# Patient Record
Sex: Male | Born: 1990 | Race: Black or African American | Hispanic: No | Marital: Single | Smoking: Current some day smoker
Health system: Southern US, Community
[De-identification: ages and names within clinical notes are randomized; demographics above are authoritative.]

## PROBLEM LIST (undated history)

## (undated) ENCOUNTER — Emergency Department (HOSPITAL_COMMUNITY): Admission: EM | Payer: Self-pay | Source: Home / Self Care

## (undated) HISTORY — PX: DENTAL SURGERY: SHX609

---
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
## 2017-03-13 ENCOUNTER — Other Ambulatory Visit: Payer: Self-pay

## 2017-03-13 ENCOUNTER — Encounter (HOSPITAL_COMMUNITY): Payer: Self-pay | Admitting: Emergency Medicine

## 2017-03-13 ENCOUNTER — Ambulatory Visit (HOSPITAL_COMMUNITY)
Admission: EM | Admit: 2017-03-13 | Discharge: 2017-03-13 | Disposition: A | Discharge status: Home / Self Care | Payer: Self-pay | Attending: Family Medicine | Admitting: Family Medicine

## 2017-03-13 DIAGNOSIS — Z711 Person with feared health complaint in whom no diagnosis is made: Secondary | ICD-10-CM

## 2017-03-13 DIAGNOSIS — Z202 Contact with and (suspected) exposure to infections with a predominantly sexual mode of transmission: Secondary | ICD-10-CM | POA: Insufficient documentation

## 2017-03-13 DIAGNOSIS — F17200 Nicotine dependence, unspecified, uncomplicated: Secondary | ICD-10-CM | POA: Insufficient documentation

## 2017-03-13 DIAGNOSIS — Z113 Encounter for screening for infections with a predominantly sexual mode of transmission: Secondary | ICD-10-CM

## 2017-03-13 MED ORDER — AZITHROMYCIN 250 MG PO TABS
ORAL_TABLET | ORAL | Status: AC
  Filled 2017-03-13: qty 4

## 2017-03-13 MED ORDER — METRONIDAZOLE 500 MG PO TABS
ORAL_TABLET | ORAL | Status: AC
  Filled 2017-03-13: qty 4

## 2017-03-13 MED ORDER — METRONIDAZOLE 500 MG PO TABS
2000.0000 mg | ORAL_TABLET | Freq: Once | ORAL | Status: AC
  Administered 2017-03-13: 2000 mg via ORAL

## 2017-03-13 MED ORDER — AZITHROMYCIN 250 MG PO TABS
1000.0000 mg | ORAL_TABLET | Freq: Once | ORAL | Status: AC
  Administered 2017-03-13: 1000 mg via ORAL

## 2017-03-13 MED ORDER — STERILE WATER FOR INJECTION IJ SOLN
INTRAMUSCULAR | Status: AC
  Filled 2017-03-13: qty 10

## 2017-03-13 MED ORDER — CEFTRIAXONE SODIUM 250 MG IJ SOLR
INTRAMUSCULAR | Status: AC
Start: 2017-03-13 — End: ?
  Filled 2017-03-13: qty 250

## 2017-03-13 MED ORDER — CEFTRIAXONE SODIUM 250 MG IJ SOLR
250.0000 mg | Freq: Once | INTRAMUSCULAR | Status: AC
  Administered 2017-03-13: 250 mg via INTRAMUSCULAR

## 2017-03-13 NOTE — Discharge Instructions (Signed)
You have been treated today for gonorrhea, chlamydia and trichomonas. Please withhold from intercourse for the next week. Use of condoms to prevent std's in the future. Will call you with any positive findings.

## 2017-03-13 NOTE — ED Provider Notes (Signed)
[facility]    CSN: [id number] Arrival date & time: 03/13/17  1103     History   Chief Complaint Chief Complaint  Patient presents with  . Exposure to STD    HPI Kyle Farmer is a 27 y.o. male.   Shreyan presents with friend with concerns of exposure to trichomonas. States he was called today and was told of possible exposure. States today he might feel a slight tingling to the tip of his penis. Otherwise without dysuria, discharge, or lesions. Denies back pain, abdominal pain, fevers. Has three partners, does not regularly use condoms. Otherwise without medical history. No known previous std. Requests full treatment for concerns for std's.    ROS per HPI.       History reviewed. No pertinent past medical history.  There are no active problems to display for this patient.   History reviewed. No pertinent surgical history.     Home Medications    Prior to Admission medications   Not on File    Family History No family history on file.  Social History Social History   Tobacco Use  . Smoking status: Current Some Day Smoker  Substance Use Topics  . Alcohol use: Yes  . Drug use: Not on file     Allergies   Patient has no known allergies.   Review of Systems Review of Systems   Physical Exam Triage Vital Signs ED Triage Vitals [03/13/17 1156]  Enc Vitals Group     BP 140/85     Pulse Rate 67     Resp 14     Temp 98.2 F (36.8 C)     Temp src      SpO2 100 %     Weight      Height      Head Circumference      Peak Flow      Pain Score      Pain Loc      Pain Edu?      Excl. in GC?    No data found.  Updated Vital Signs BP 140/85 (BP Location: Right Arm)   Pulse 67   Temp 98.2 F (36.8 C)   Resp 14   SpO2 100%   Visual Acuity Right Eye Distance:   Left Eye Distance:   Bilateral Distance:    Right Eye Near:   Left Eye Near:    Bilateral Near:     Physical Exam  Constitutional: He is oriented to person, place,  and time. He appears well-developed and well-nourished.  Cardiovascular: Normal rate and regular rhythm.  Pulmonary/Chest: Effort normal and breath sounds normal.  Abdominal: There is no tenderness.  Genitourinary:  Genitourinary Comments: Denies sores, lesions or discharge, exam deferred.   Neurological: He is alert and oriented to person, place, and time.  Skin: Skin is warm and dry.     UC Treatments / Results  Labs (all labs ordered are listed, but only abnormal results are displayed) Labs Reviewed  URINE CYTOLOGY ANCILLARY ONLY    EKG  EKG Interpretation None       Radiology No results found.  Procedures Procedures (including critical care time)  Medications Ordered in UC Medications  metroNIDAZOLE (FLAGYL) tablet 2,000 mg (not administered)  azithromycin (ZITHROMAX) tablet 1,000 mg (not administered)  cefTRIAXone (ROCEPHIN) injection 250 mg (not administered)     Initial Impression / Assessment and Plan / UC Course  I have reviewed the triage vital signs and the nursing notes.  Pertinent labs & imaging results that were available during my care of the patient were reviewed by me and considered in my medical decision making (see chart for details).     Flagyl, zithromax and rocephin provided today per patient request. Cytology pending. Withhold from intercourse for the next week. Condoms to prevent std's in the future. Will call with any positive findings. Patient verbalized understanding and agreeable to plan.    Final Clinical Impressions(s) / UC Diagnoses   Final diagnoses:  Concern about STD in male without diagnosis  Possible exposure to STD    ED Discharge Orders    None       Controlled Substance Prescriptions [place] Controlled Substance Registry consulted? Not Applicable   Georgetta HaberBurky, Cydnie Deason B, NP 03/13/17 1243

## 2017-03-13 NOTE — ED Triage Notes (Signed)
Pt states he got a phone call saying he needed to get tested, they were told they had trich. Pt denies symptoms.

## 2017-03-16 LAB — URINE CYTOLOGY ANCILLARY ONLY
CHLAMYDIA, DNA PROBE: NEGATIVE
NEISSERIA GONORRHEA: NEGATIVE
Trichomonas: POSITIVE — AB

## 2017-06-25 ENCOUNTER — Emergency Department (HOSPITAL_COMMUNITY)
Admission: EM | Admit: 2017-06-25 | Discharge: 2017-06-25 | Payer: Self-pay | Attending: Emergency Medicine | Admitting: Emergency Medicine

## 2017-06-25 ENCOUNTER — Encounter (HOSPITAL_COMMUNITY): Payer: Self-pay | Admitting: *Deleted

## 2017-06-25 ENCOUNTER — Other Ambulatory Visit: Payer: Self-pay

## 2017-06-25 DIAGNOSIS — Z87891 Personal history of nicotine dependence: Secondary | ICD-10-CM | POA: Insufficient documentation

## 2017-06-25 DIAGNOSIS — T5492XA Toxic effect of unspecified corrosive substance, intentional self-harm, initial encounter: Secondary | ICD-10-CM | POA: Insufficient documentation

## 2017-06-25 LAB — COMPREHENSIVE METABOLIC PANEL
ALK PHOS: 70 U/L (ref 38–126)
ALT: 16 U/L — ABNORMAL LOW (ref 17–63)
AST: 20 U/L (ref 15–41)
Albumin: 3.9 g/dL (ref 3.5–5.0)
Anion gap: 9 (ref 5–15)
BILIRUBIN TOTAL: 0.6 mg/dL (ref 0.3–1.2)
BUN: 13 mg/dL (ref 6–20)
CALCIUM: 9.5 mg/dL (ref 8.9–10.3)
CO2: 31 mmol/L (ref 22–32)
CREATININE: 0.84 mg/dL (ref 0.61–1.24)
Chloride: 99 mmol/L — ABNORMAL LOW (ref 101–111)
GFR calc Af Amer: >60 mL/min (ref >60)
GLUCOSE: 104 mg/dL — AB (ref 65–99)
POTASSIUM: 3.5 mmol/L (ref 3.5–5.1)
Sodium: 139 mmol/L (ref 135–145)
TOTAL PROTEIN: 7.4 g/dL (ref 6.5–8.1)

## 2017-06-25 LAB — URINALYSIS, ROUTINE W REFLEX MICROSCOPIC
BILIRUBIN URINE: NEGATIVE
GLUCOSE, UA: NEGATIVE mg/dL
HGB URINE DIPSTICK: NEGATIVE
KETONES UR: NEGATIVE mg/dL
Leukocytes, UA: NEGATIVE
Nitrite: NEGATIVE
PROTEIN: NEGATIVE mg/dL
Specific Gravity, Urine: 1.009 (ref 1.005–1.030)
pH: 6 (ref 5.0–8.0)

## 2017-06-25 LAB — CBC WITH DIFFERENTIAL/PLATELET
BASOS ABS: 0 10*3/uL (ref 0.0–0.1)
Basophils Relative: 0 %
Eosinophils Absolute: 0.1 10*3/uL (ref 0.0–0.7)
Eosinophils Relative: 2 %
HEMATOCRIT: 44.5 % (ref 39.0–52.0)
HEMOGLOBIN: 14.7 g/dL (ref 13.0–17.0)
Lymphocytes Relative: 16 %
Lymphs Abs: 1.2 10*3/uL (ref 0.7–4.0)
MCH: 26.9 pg (ref 26.0–34.0)
MCHC: 33 g/dL (ref 30.0–36.0)
MCV: 81.4 fL (ref 78.0–100.0)
MONO ABS: 1.1 10*3/uL — AB (ref 0.1–1.0)
Monocytes Relative: 15 %
NEUTROS ABS: 5.1 10*3/uL (ref 1.7–7.7)
NEUTROS PCT: 67 %
Platelets: 255 10*3/uL (ref 150–400)
RBC: 5.47 MIL/uL (ref 4.22–5.81)
RDW: 13.4 % (ref 11.5–15.5)
WBC: 7.5 10*3/uL (ref 4.0–10.5)

## 2017-06-25 MED ORDER — SODIUM CHLORIDE 0.9 % IV BOLUS
1000.0000 mL | Freq: Once | INTRAVENOUS | Status: AC
  Administered 2017-06-25: 1000 mL via INTRAVENOUS

## 2017-06-25 MED ORDER — ONDANSETRON HCL 4 MG/2ML IJ SOLN
4.0000 mg | Freq: Once | INTRAMUSCULAR | Status: AC
  Administered 2017-06-25: 4 mg via INTRAVENOUS
  Filled 2017-06-25: qty 2
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
## 2017-06-25 NOTE — Discharge Instructions (Addendum)
Follow-up if any problems drink plenty of fluids
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
## 2017-06-25 NOTE — ED Notes (Signed)
Pt resting with eyes closed, appears to be in no distress. Respirations are even and unlabored. Officer at bedside.

## 2017-09-04 ENCOUNTER — Ambulatory Visit (HOSPITAL_COMMUNITY)
Admission: EM | Admit: 2017-09-04 | Discharge: 2017-09-04 | Disposition: A | Discharge status: Home / Self Care | Payer: Self-pay | Attending: Internal Medicine | Admitting: Internal Medicine

## 2017-09-04 ENCOUNTER — Encounter (HOSPITAL_COMMUNITY): Payer: Self-pay | Admitting: Emergency Medicine

## 2017-09-04 DIAGNOSIS — Z87891 Personal history of nicotine dependence: Secondary | ICD-10-CM | POA: Insufficient documentation

## 2017-09-04 DIAGNOSIS — Z113 Encounter for screening for infections with a predominantly sexual mode of transmission: Secondary | ICD-10-CM

## 2017-09-04 DIAGNOSIS — Z711 Person with feared health complaint in whom no diagnosis is made: Secondary | ICD-10-CM

## 2017-09-04 DIAGNOSIS — L739 Follicular disorder, unspecified: Secondary | ICD-10-CM | POA: Insufficient documentation

## 2017-09-04 MED ORDER — MUPIROCIN 2 % EX OINT
1.0000 | TOPICAL_OINTMENT | Freq: Two times a day (BID) | CUTANEOUS | 0 refills | Status: DC
Start: 2017-09-04 — End: 2018-07-20

## 2017-09-04 NOTE — Discharge Instructions (Signed)
As discussed, rash to the groin area most consistent with folliculitis. Apply bactroban ointment to the area. Warm compres. Testing sent, you will be contacted with any positive results that requires further treatment. Refrain from sexual activity and alcohol use for the next 7 days. Monitor for any worsening of symptoms, fever, abdominal pain, nausea, vomiting, testicular swelling/pain, penile lesion/ulcer/sore to follow up for reevaluation.

## 2017-09-04 NOTE — ED Triage Notes (Signed)
Pt requesting STD screening and c/o rash in genital area

## 2017-09-04 NOTE — ED Provider Notes (Signed)
[facility]    CSN: [id number] Arrival date & time: 09/04/17  1601     History   Chief Complaint Chief Complaint  Patient presents with  . Exposure to STD    HPI Kyle Farmer is a 27 y.o. male.   27 year old male comes in for STD testing and rash to the groin area.  States rash started after shaving, with whiteheads, itching, irritation.  Denies spreading erythema, increased warmth, fever.  Denies penile discharge, penile ulcer, testicular swelling, testicular pain.  Denies abdominal pain, nausea, vomiting.  Denies fever, chills, night sweats.  Denies urinary symptoms such as frequency, dysuria, hematuria.  He is sexually active with one male partner, no condom use.  Says he would like to have STD check, including HIV and syphilis.     History reviewed. No pertinent past medical history.  There are no active problems to display for this patient.   History reviewed. No pertinent surgical history.     Home Medications    Prior to Admission medications   Medication Sig Start Date End Date Taking? Authorizing Provider  mupirocin ointment (BACTROBAN) 2 % Apply 1 application topically 2 (two) times daily. 09/04/17   Belinda Fisher, PA-C    Family History History reviewed. No pertinent family history.  Social History Social History   Tobacco Use  . Smoking status: Former Games developer  . Smokeless tobacco: Never Used  Substance Use Topics  . Alcohol use: Not Currently  . Drug use: Yes    Types: Marijuana     Allergies   Patient has no known allergies.   Review of Systems Review of Systems  Reason unable to perform ROS: See HPI as above.     Physical Exam Triage Vital Signs ED Triage Vitals [09/04/17 1629]  Enc Vitals Group     BP 121/69     Pulse Rate 68     Resp 18     Temp 98.2 F (36.8 C)     Temp Source Oral     SpO2 100 %     Weight      Height      Head Circumference      Peak Flow      Pain Score 0     Pain Loc      Pain Edu?    Excl. in GC?    No data found.  Updated Vital Signs BP 121/69 (BP Location: Left Arm)   Pulse 68   Temp 98.2 F (36.8 C) (Oral)   Resp 18   SpO2 100%   Physical Exam  Constitutional: He is oriented to person, place, and time. He appears well-developed and well-nourished. No distress.  HENT:  Head: Normocephalic and atraumatic.  Eyes: Pupils are equal, round, and reactive to light. Conjunctivae are normal.  Genitourinary:  Genitourinary Comments: No ulceration to the penis.  Folliculitis to the groin area.  Mild tenderness to palpation.  No surrounding erythema, increased warmth.  No fluctuance felt.  Neurological: He is alert and oriented to person, place, and time.  Skin: He is not diaphoretic.     UC Treatments / Results  Labs (all labs ordered are listed, but only abnormal results are displayed) Labs Reviewed  HIV ANTIBODY (ROUTINE TESTING)  RPR  URINE CYTOLOGY ANCILLARY ONLY    EKG None  Radiology No results found.  Procedures Procedures (including critical care time)  Medications Ordered in UC Medications - No data to display  Initial Impression / Assessment and Plan /  UC Course  I have reviewed the triage vital signs and the nursing notes.  Pertinent labs & imaging results that were available during my care of the patient were reviewed by me and considered in my medical decision making (see chart for details).    Bactroban for folliculitis.  Warm compress.  Blood work and cytology sent, patient will be contacted with any positive results that require additional treatment. Patient to refrain from sexual activity for the next 7 days. Return precautions given.   Final Clinical Impressions(s) / UC Diagnoses   Final diagnoses:  Folliculitis  Concern about STD in male without diagnosis    ED Prescriptions    Medication Sig Dispense Auth. Provider   mupirocin ointment (BACTROBAN) 2 % Apply 1 application topically 2 (two) times daily. 22 g Threasa AlphaYu, Ryelynn Guedea V, PA-C          Ixel Boehning V, [place]PA-C 09/04/17 [phone]

## 2017-09-05 LAB — RPR: RPR: NONREACTIVE

## 2017-09-05 LAB — HIV ANTIBODY (ROUTINE TESTING W REFLEX): HIV Screen 4th Generation wRfx: NONREACTIVE

## 2017-09-07 LAB — URINE CYTOLOGY ANCILLARY ONLY
CHLAMYDIA, DNA PROBE: NEGATIVE
Neisseria Gonorrhea: NEGATIVE
TRICH (WINDOWPATH): POSITIVE — AB

## 2017-09-08 ENCOUNTER — Telehealth (HOSPITAL_COMMUNITY): Payer: Self-pay

## 2017-09-08 MED ORDER — METRONIDAZOLE 500 MG PO TABS: 500.0000 mg | ORAL_TABLET | Freq: Two times a day (BID) | ORAL | 0 refills | Status: DC

## 2017-09-08 NOTE — Telephone Encounter (Signed)
Trichomonas is positive. Rx metronidazole 500mg  bid x 7d #14 no refills was sent to the pharmacy of record. Need to educate patient to refrain from sexual intercourse for 7 days to give the medicine time to work. Sexual partners need to be notified and tested/treated. Condoms may reduce risk of reinfection. Number provided is not correct number. Will send patient letter.

## 2017-09-09 LAB — URINE CYTOLOGY ANCILLARY ONLY
Bacterial vaginitis: NEGATIVE
CANDIDA VAGINITIS: NEGATIVE

## 2017-09-26 ENCOUNTER — Ambulatory Visit (HOSPITAL_COMMUNITY)
Admission: EM | Admit: 2017-09-26 | Discharge: 2017-09-26 | Disposition: A | Discharge status: Home / Self Care | Payer: Self-pay | Attending: Internal Medicine | Admitting: Internal Medicine

## 2017-09-26 ENCOUNTER — Other Ambulatory Visit: Payer: Self-pay

## 2017-09-26 ENCOUNTER — Encounter (HOSPITAL_COMMUNITY): Payer: Self-pay | Admitting: Emergency Medicine

## 2017-09-26 DIAGNOSIS — L509 Urticaria, unspecified: Secondary | ICD-10-CM

## 2017-09-26 MED ORDER — CEPHALEXIN 500 MG PO CAPS: 500.0000 mg | ORAL_CAPSULE | Freq: Four times a day (QID) | ORAL | 0 refills | Status: DC

## 2017-09-26 MED ORDER — PERMETHRIN 5 % EX CREA: TOPICAL_CREAM | CUTANEOUS | 0 refills | Status: DC

## 2017-09-26 MED ORDER — DEXAMETHASONE SODIUM PHOSPHATE 10 MG/ML IJ SOLN
10.0000 mg | Freq: Once | INTRAMUSCULAR | Status: AC
  Administered 2017-09-26: 10 mg via INTRAMUSCULAR

## 2017-09-26 MED ORDER — PREDNISONE 10 MG (21) PO TBPK: ORAL_TABLET | Freq: Every day | ORAL | 0 refills | Status: DC

## 2017-09-26 MED ORDER — DIPHENHYDRAMINE HCL 50 MG/ML IJ SOLN
INTRAMUSCULAR | Status: AC
  Filled 2017-09-26: qty 1

## 2017-09-26 MED ORDER — DIPHENHYDRAMINE HCL 50 MG/ML IJ SOLN
25.0000 mg | Freq: Once | INTRAMUSCULAR | Status: AC
  Administered 2017-09-26: 25 mg via INTRAMUSCULAR

## 2017-09-26 MED ORDER — DEXAMETHASONE SODIUM PHOSPHATE 10 MG/ML IJ SOLN
INTRAMUSCULAR | Status: AC
  Filled 2017-09-26: qty 1

## 2017-09-26 NOTE — ED Triage Notes (Signed)
2 days ago noticed a sting to left neck.  Then had burning and itching and neck swelling.  Then yesterday night scalp started itching.  Bumps/bites visible to right forehead and left neck.

## 2017-09-26 NOTE — ED Provider Notes (Signed)
[facility]    CSN: [id number] Arrival date & time: 09/26/17  1257     History   Chief Complaint Chief Complaint  Patient presents with  . Allergic Reaction    HPI Kyle Farmer is a 27 y.o. male.   27y.o. male presents with rash located to his neck X 2 days.  She states he was sitting at a friend's house on a couch when he thought he got bit by something.  Rashes since spread along his scalp line to his forehead.  Patient endorses pruritus. condition is acute in nature. Condition is made better by nothing. Condition is made worse by nohing. Patient denies any treatment prior to there arrival at this facility. Patient denies any fevers or friends and family with similar signs and symptoms.      History reviewed. No pertinent past medical history.  There are no active problems to display for this patient.   History reviewed. No pertinent surgical history.     Home Medications    Prior to Admission medications   Medication Sig Start Date End Date Taking? Authorizing Provider  metroNIDAZOLE (FLAGYL) 500 MG tablet Take 1 tablet (500 mg total) by mouth 2 (two) times daily. 09/08/17   Isa RankinMurray, Laura Wilson, MD  mupirocin ointment (BACTROBAN) 2 % Apply 1 application topically 2 (two) times daily. 09/04/17   Belinda FisherYu, Amy V, PA-C    Family History Family History  Problem Relation Age of Onset  . Healthy Mother     Social History Social History   Tobacco Use  . Smoking status: Current Some Day Smoker  . Smokeless tobacco: Never Used  Substance Use Topics  . Alcohol use: Not Currently  . Drug use: Not Currently    Types: Marijuana     Allergies   Patient has no known allergies.   Review of Systems Review of Systems  Constitutional: Negative for chills and fever.  HENT: Negative for ear pain and sore throat.   Eyes: Negative for pain and visual disturbance.  Respiratory: Negative for cough and shortness of breath.   Cardiovascular: Negative for chest  pain and palpitations.  Gastrointestinal: Negative for abdominal pain and vomiting.  Genitourinary: Negative for dysuria and hematuria.  Musculoskeletal: Negative for arthralgias and back pain.  Skin: Positive for rash. Negative for color change.       The back of her neck radiating around her forehead  Neurological: Negative for seizures and syncope.  All other systems reviewed and are negative.    Physical Exam Triage Vital Signs ED Triage Vitals  Enc Vitals Group     BP 09/26/17 1420 133/79     Pulse Rate 09/26/17 1420 62     Resp 09/26/17 1420 18     Temp 09/26/17 1420 97.9 F (36.6 C)     Temp Source 09/26/17 1420 Oral     SpO2 09/26/17 1420 100 %     Weight --      Height --      Head Circumference --      Peak Flow --      Pain Score 09/26/17 1417 0     Pain Loc --      Pain Edu? --      Excl. in GC? --    No data found.  Updated Vital Signs BP 133/79 (BP Location: Left Arm)   Pulse 62   Temp 97.9 F (36.6 C) (Oral)   Resp 18   SpO2 100%   Visual Acuity  Right Eye Distance:   Left Eye Distance:   Bilateral Distance:    Right Eye Near:   Left Eye Near:    Bilateral Near:     Physical Exam  Constitutional: He appears well-developed and well-nourished.  HENT:  Head: Normocephalic and atraumatic.  Eyes: Conjunctivae are normal.  Neck: Neck supple.  Cardiovascular: Normal rate and regular rhythm.  No murmur heard. Pulmonary/Chest: Effort normal and breath sounds normal. No respiratory distress.  Abdominal: Soft. There is no tenderness.  Musculoskeletal: He exhibits no edema.  Neurological: He is alert.  Skin: Skin is warm and dry. Rash noted.  Raised bumps with center blister  Psychiatric: He has a normal mood and affect.  Nursing note and vitals reviewed.    UC Treatments / Results  Labs (all labs ordered are listed, but only abnormal results are displayed) Labs Reviewed - No data to display  EKG None  Radiology No results  found.  Procedures Procedures (including critical care time)  Medications Ordered in UC Medications - No data to display  Initial Impression / Assessment and Plan / UC Course  I have reviewed the triage vital signs and the nursing notes.  Pertinent labs & imaging results that were available during my care of the patient were reviewed by me and considered in my medical decision making (see chart for details).      Final Clinical Impressions(s) / UC Diagnoses   Final diagnoses:  None   Discharge Instructions   None    ED Prescriptions    None     Controlled Substance Prescriptions [place] Controlled Substance Registry consulted? Not Applicable   Alene Mires, NP 09/26/17 1539

## 2017-10-16 ENCOUNTER — Ambulatory Visit (HOSPITAL_COMMUNITY)
Admission: EM | Admit: 2017-10-16 | Discharge: 2017-10-16 | Disposition: A | Discharge status: Home / Self Care | Payer: Self-pay | Attending: Internal Medicine | Admitting: Internal Medicine

## 2017-10-16 ENCOUNTER — Encounter (HOSPITAL_COMMUNITY): Payer: Self-pay

## 2017-10-16 DIAGNOSIS — F17200 Nicotine dependence, unspecified, uncomplicated: Secondary | ICD-10-CM | POA: Insufficient documentation

## 2017-10-16 DIAGNOSIS — Z113 Encounter for screening for infections with a predominantly sexual mode of transmission: Secondary | ICD-10-CM

## 2017-10-16 DIAGNOSIS — Z202 Contact with and (suspected) exposure to infections with a predominantly sexual mode of transmission: Secondary | ICD-10-CM | POA: Insufficient documentation

## 2017-10-16 MED ORDER — AZITHROMYCIN 250 MG PO TABS
ORAL_TABLET | ORAL | Status: AC
  Filled 2017-10-16: qty 4

## 2017-10-16 MED ORDER — CEFTRIAXONE SODIUM 250 MG IJ SOLR
INTRAMUSCULAR | Status: AC
  Filled 2017-10-16: qty 250

## 2017-10-16 MED ORDER — CEFTRIAXONE SODIUM 250 MG IJ SOLR
250.0000 mg | Freq: Once | INTRAMUSCULAR | Status: AC
  Administered 2017-10-16: 250 mg via INTRAMUSCULAR

## 2017-10-16 MED ORDER — METRONIDAZOLE 500 MG PO TABS
2000.0000 mg | ORAL_TABLET | Freq: Once | ORAL | Status: AC
  Administered 2017-10-16: 2000 mg via ORAL

## 2017-10-16 MED ORDER — METRONIDAZOLE 500 MG PO TABS
ORAL_TABLET | ORAL | Status: AC
  Filled 2017-10-16: qty 4

## 2017-10-16 MED ORDER — AZITHROMYCIN 250 MG PO TABS
1000.0000 mg | ORAL_TABLET | Freq: Once | ORAL | Status: AC
  Administered 2017-10-16: 1000 mg via ORAL
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
## 2017-10-16 NOTE — Discharge Instructions (Signed)
You were treated empirically for gonorrhea, chlamydia, trichomonas.  Flagyl 1 g by mouth, azithromycin 1g by mouth and Rocephin 250mg  injection given in office today. Cytology sent, you will be contacted with any positive results that requires further treatment. Refrain from sexual activity and alcohol use for the next 7 days. Monitor for any worsening of symptoms, fever, abdominal pain, nausea, vomiting, penile lesion, testicular swelling/pain to follow up for reevaluation.

## 2017-10-16 NOTE — ED Provider Notes (Signed)
[facility]    CSN: [id number] Arrival date & time: 10/16/17  1412     History   Chief Complaint Chief Complaint  Patient presents with  . Exposure to STD    HPI Kyle Farmer is a 27 y.o. male.   27 year old male comes in for STD exposure.  States current partner was tested positive for gonorrhea and chlamydia.  He is asymptomatic.  Denies nausea, vomiting, abdominal pain.  Denies fever, chills, night sweats.  Denies urinary symptoms such as frequency, dysuria, hematuria.  Denies penile discharge, penile lesion, testicular swelling, testicular pain.  He is sexually active with one male partner, no condom use.     History reviewed. No pertinent past medical history.  There are no active problems to display for this patient.   History reviewed. No pertinent surgical history.     Home Medications    Prior to Admission medications   Medication Sig Start Date End Date Taking? Authorizing Provider  mupirocin ointment (BACTROBAN) 2 % Apply 1 application topically 2 (two) times daily. 09/04/17   Belinda Fisher, PA-C  permethrin (ELIMITE) 5 % cream Apply to affected area once 09/26/17   Alene Mires, NP  predniSONE (STERAPRED UNI-PAK 21 TAB) 10 MG (21) TBPK tablet Take by mouth daily. Take 6 tabs by mouth daily  for 2 days, then 5 tabs for 2 days, then 4 tabs for 2 days, then 3 tabs for 2 days, 2 tabs for 2 days, then 1 tab by mouth daily for 2 days 09/26/17   Alene Mires, NP    Family History Family History  Problem Relation Age of Onset  . Healthy Mother     Social History Social History   Tobacco Use  . Smoking status: Current Some Day Smoker  . Smokeless tobacco: Never Used  Substance Use Topics  . Alcohol use: Not Currently  . Drug use: Not Currently    Types: Marijuana     Allergies   Patient has no known allergies.   Review of Systems Review of Systems  Reason unable to perform ROS: See HPI as above.     Physical  Exam Triage Vital Signs ED Triage Vitals  Enc Vitals Group     BP 10/16/17 1450 127/69     Pulse Rate 10/16/17 1450 66     Resp 10/16/17 1450 20     Temp 10/16/17 1450 98 F (36.7 C)     Temp Source 10/16/17 1450 Oral     SpO2 10/16/17 1450 98 %     Weight --      Height --      Head Circumference --      Peak Flow --      Pain Score 10/16/17 1449 0     Pain Loc --      Pain Edu? --      Excl. in GC? --    No data found.  Updated Vital Signs BP 127/69 (BP Location: Left Arm)   Pulse 66   Temp 98 F (36.7 C) (Oral)   Resp 20   SpO2 98%   Physical Exam  Constitutional: He is oriented to person, place, and time. He appears well-developed and well-nourished. No distress.  HENT:  Head: Normocephalic and atraumatic.  Eyes: Pupils are equal, round, and reactive to light. Conjunctivae are normal.  Neurological: He is alert and oriented to person, place, and time.  Skin: He is not diaphoretic.     UC Treatments /  Results  Labs (all labs ordered are listed, but only abnormal results are displayed) Labs Reviewed  URINE CYTOLOGY ANCILLARY ONLY    EKG None  Radiology No results found.  Procedures Procedures (including critical care time)  Medications Ordered in UC Medications  azithromycin (ZITHROMAX) tablet 1,000 mg (1,000 mg Oral Given 10/16/17 1530)  cefTRIAXone (ROCEPHIN) injection 250 mg (250 mg Intramuscular Given 10/16/17 1530)  metroNIDAZOLE (FLAGYL) tablet 2,000 mg (2,000 mg Oral Given 10/16/17 1530)    Initial Impression / Assessment and Plan / UC Course  I have reviewed the triage vital signs and the nursing notes.  Pertinent labs & imaging results that were available during my care of the patient were reviewed by me and considered in my medical decision making (see chart for details).    Patient will like to be covered for "everything".  Flagyl, azithromycin and Rocephin given in office today. Cytology sent, patient will be contacted with any positive  results that require additional treatment. Patient to refrain from sexual activity for the next 7 days. Return precautions given.   Final Clinical Impressions(s) / UC Diagnoses   Final diagnoses:  Exposure to STD    ED Prescriptions    None        Belinda FisherYu, Areebah Meinders V, PA-C 10/16/17 1621

## 2017-10-20 LAB — URINE CYTOLOGY ANCILLARY ONLY
CHLAMYDIA, DNA PROBE: NEGATIVE
NEISSERIA GONORRHEA: NEGATIVE
TRICH (WINDOWPATH): NEGATIVE

## 2017-12-04 ENCOUNTER — Other Ambulatory Visit: Payer: Self-pay

## 2017-12-04 ENCOUNTER — Ambulatory Visit (HOSPITAL_COMMUNITY)
Admission: EM | Admit: 2017-12-04 | Discharge: 2017-12-04 | Disposition: A | Discharge status: Home / Self Care | Payer: Self-pay | Attending: Family Medicine | Admitting: Family Medicine

## 2017-12-04 ENCOUNTER — Encounter (HOSPITAL_COMMUNITY): Payer: Self-pay | Admitting: Emergency Medicine

## 2017-12-04 DIAGNOSIS — Z202 Contact with and (suspected) exposure to infections with a predominantly sexual mode of transmission: Secondary | ICD-10-CM | POA: Insufficient documentation

## 2017-12-04 DIAGNOSIS — Z113 Encounter for screening for infections with a predominantly sexual mode of transmission: Secondary | ICD-10-CM | POA: Insufficient documentation

## 2017-12-04 MED ORDER — CEFTRIAXONE SODIUM 250 MG IJ SOLR
INTRAMUSCULAR | Status: AC
  Filled 2017-12-04: qty 250

## 2017-12-04 MED ORDER — AZITHROMYCIN 250 MG PO TABS
ORAL_TABLET | ORAL | Status: AC
  Filled 2017-12-04: qty 4

## 2017-12-04 MED ORDER — AZITHROMYCIN 250 MG PO TABS
1000.0000 mg | ORAL_TABLET | Freq: Once | ORAL | Status: AC
  Administered 2017-12-04: 1000 mg via ORAL

## 2017-12-04 MED ORDER — CEFTRIAXONE SODIUM 250 MG IJ SOLR
250.0000 mg | Freq: Once | INTRAMUSCULAR | Status: AC
  Administered 2017-12-04: 250 mg via INTRAMUSCULAR

## 2017-12-04 NOTE — Discharge Instructions (Signed)
We have treated you today for gonorrhea and chlamydia, with rocephin and azithromycin. Please refrain from sexual activity for 7 days while medicine is clearing infection. ° °We are testing you for HIV, Syphillis, Gonorrhea, Chlamydia and Trichomonas. We will call you if anything is positive and let you know if you require any further treatment. Please inform partner of any positive results. ° °Please return if symptoms not improving with treatment, development of fever, nausea, vomiting, abdominal pain, scrotal pain. °

## 2017-12-04 NOTE — ED Triage Notes (Addendum)
Patient stated that he would like a general screening for STDs. He would like to have every testing possible completed.  Triage done by Nelva Bush, SRN

## 2017-12-04 NOTE — ED Provider Notes (Signed)
[facility]    CSN: [id number] Arrival date & time: 12/04/17  0347     History   Chief Complaint Chief Complaint  Patient presents with  . SEXUALLY TRANSMITTED DISEASE    testing    HPI Kyle Farmer is a 27 y.o. male no significant PMH presenting today for STD screening.  Patient states that he would like to be checked for all STDs.  Patient states that he has 2 partners and 1 of them is also being checked today as well.  He is unsure if either of his partners are having symptoms.  He denies penile discharge, dysuria, increased frequency, rashes or lesions.  Denies abdominal pain.  Patient has had some nausea, but this is been going on since he was released from prison.  Denies vomiting or abdominal pain.  Denies fevers.  Interactions are male-male.  Requesting empiric treatment today.  HPI  History reviewed. No pertinent past medical history.  There are no active problems to display for this patient.   History reviewed. No pertinent surgical history.     Home Medications    Prior to Admission medications   Medication Sig Start Date End Date Taking? Authorizing Provider  mupirocin ointment (BACTROBAN) 2 % Apply 1 application topically 2 (two) times daily. 09/04/17   Belinda Fisher, PA-C  permethrin (ELIMITE) 5 % cream Apply to affected area once 09/26/17   Alene Mires, NP    Family History Family History  Problem Relation Age of Onset  . Healthy Mother     Social History Social History   Tobacco Use  . Smoking status: Current Some Day Smoker  . Smokeless tobacco: Never Used  Substance Use Topics  . Alcohol use: Yes  . Drug use: Yes    Types: Marijuana     Allergies   Patient has no known allergies.   Review of Systems Review of Systems  Constitutional: Negative for fever.  HENT: Negative for sore throat.   Respiratory: Negative for shortness of breath.   Cardiovascular: Negative for chest pain.  Gastrointestinal: Positive for  nausea. Negative for abdominal pain and vomiting.  Genitourinary: Negative for difficulty urinating, discharge, dysuria, frequency, penile pain, penile swelling, scrotal swelling and testicular pain.  Skin: Negative for rash.  Neurological: Negative for dizziness, light-headedness and headaches.     Physical Exam Triage Vital Signs ED Triage Vitals [12/04/17 0944]  Enc Vitals Group     BP      Pulse      Resp      Temp      Temp src      SpO2      Weight      Height      Head Circumference      Peak Flow      Pain Score 0     Pain Loc      Pain Edu?      Excl. in GC?    No data found.  Updated Vital Signs There were no vitals taken for this visit.  Visual Acuity Right Eye Distance:   Left Eye Distance:   Bilateral Distance:    Right Eye Near:   Left Eye Near:    Bilateral Near:     Physical Exam  Constitutional: He is oriented to person, place, and time. He appears well-developed and well-nourished.  No acute distress  HENT:  Head: Normocephalic and atraumatic.  Nose: Nose normal.  Eyes: Conjunctivae are normal.  Neck: Neck supple.  Cardiovascular: Normal rate.  Pulmonary/Chest: Effort normal. No respiratory distress.  Abdominal: He exhibits no distension.  Musculoskeletal: Normal range of motion.  Neurological: He is alert and oriented to person, place, and time.  Skin: Skin is warm and dry.  Psychiatric: He has a normal mood and affect.  Nursing note and vitals reviewed.    UC Treatments / Results  Labs (all labs ordered are listed, but only abnormal results are displayed) Labs Reviewed  HIV ANTIBODY (ROUTINE TESTING W REFLEX)  RPR  URINE CYTOLOGY ANCILLARY ONLY    EKG None  Radiology No results found.  Procedures Procedures (including critical care time)  Medications Ordered in UC Medications  azithromycin (ZITHROMAX) tablet 1,000 mg (has no administration in time range)  cefTRIAXone (ROCEPHIN) injection 250 mg (has no administration  in time range)    Initial Impression / Assessment and Plan / UC Course  I have reviewed the triage vital signs and the nursing notes.  Pertinent labs & imaging results that were available during my care of the patient were reviewed by me and considered in my medical decision making (see chart for details).     STD screening, urine cytology sent to check for GC/chlamydia, will also check HIV and syphilis per his request.  Rocephin and azithromycin provided in clinic today, advised to hold off on sexual intercourse for at least 7 days after treatment.Discussed strict return precautions. Patient verbalized understanding and is agreeable with plan.  Final Clinical Impressions(s) / UC Diagnoses   Final diagnoses:  Screen for STD (sexually transmitted disease)  Possible exposure to STD     Discharge Instructions     We have treated you today for gonorrhea and chlamydia, with rocephin and azithromycin. Please refrain from sexual activity for 7 days while medicine is clearing infection.  We are testing you for HIV, Syphillis, Gonorrhea, Chlamydia and Trichomonas. We will call you if anything is positive and let you know if you require any further treatment. Please inform partner of any positive results.  Please return if symptoms not improving with treatment, development of fever, nausea, vomiting, abdominal pain, scrotal pain.    ED Prescriptions    None     Controlled Substance Prescriptions [place] Controlled Substance Registry consulted? Not Applicable   Lew Dawes, [place] 12/04/17 1017

## 2017-12-05 LAB — HIV ANTIBODY (ROUTINE TESTING W REFLEX): HIV Screen 4th Generation wRfx: NONREACTIVE

## 2017-12-06 LAB — RPR: RPR Ser Ql: REACTIVE — AB

## 2017-12-06 LAB — RPR, QUANT+TP ABS (REFLEX)
Rapid Plasma Reagin, Quant: 1:2 {titer} — ABNORMAL HIGH
T Pallidum Abs: NEGATIVE

## 2017-12-07 ENCOUNTER — Telehealth: Payer: Self-pay | Admitting: Emergency Medicine

## 2017-12-07 LAB — URINE CYTOLOGY ANCILLARY ONLY
CHLAMYDIA, DNA PROBE: NEGATIVE
NEISSERIA GONORRHEA: NEGATIVE
TRICH (WINDOWPATH): NEGATIVE

## 2017-12-07 NOTE — Telephone Encounter (Signed)
Called patient, demographics verified, results communicated, (positive RPR) patient treated at visit, patient aware of results and need to alert any sexual partners, and abstain from sexual contact for 7 days, also made aware of report to health department. nmw

## 2017-12-14 ENCOUNTER — Telehealth: Payer: Self-pay | Admitting: Emergency Medicine

## 2017-12-14 NOTE — Telephone Encounter (Signed)
Health Dept notified.

## 2017-12-21 ENCOUNTER — Emergency Department (HOSPITAL_COMMUNITY): Payer: Self-pay

## 2017-12-21 ENCOUNTER — Other Ambulatory Visit: Payer: Self-pay

## 2017-12-21 ENCOUNTER — Emergency Department (HOSPITAL_COMMUNITY)
Admission: EM | Admit: 2017-12-21 | Discharge: 2017-12-21 | Disposition: A | Discharge status: Home / Self Care | Payer: Self-pay | Attending: Emergency Medicine | Admitting: Emergency Medicine

## 2017-12-21 DIAGNOSIS — F17200 Nicotine dependence, unspecified, uncomplicated: Secondary | ICD-10-CM | POA: Insufficient documentation

## 2017-12-21 DIAGNOSIS — Y929 Unspecified place or not applicable: Secondary | ICD-10-CM | POA: Insufficient documentation

## 2017-12-21 DIAGNOSIS — W230XXA Caught, crushed, jammed, or pinched between moving objects, initial encounter: Secondary | ICD-10-CM | POA: Insufficient documentation

## 2017-12-21 DIAGNOSIS — Y9389 Activity, other specified: Secondary | ICD-10-CM | POA: Insufficient documentation

## 2017-12-21 DIAGNOSIS — Y999 Unspecified external cause status: Secondary | ICD-10-CM | POA: Insufficient documentation

## 2017-12-21 DIAGNOSIS — S9031XA Contusion of right foot, initial encounter: Secondary | ICD-10-CM | POA: Insufficient documentation

## 2017-12-21 MED ORDER — IBUPROFEN 400 MG PO TABS
600.0000 mg | ORAL_TABLET | Freq: Once | ORAL | Status: AC
  Administered 2017-12-21: 600 mg via ORAL
  Filled 2017-12-21: qty 1

## 2017-12-21 NOTE — ED Notes (Signed)
Ortho tech bedside applying crutches and ace wrap

## 2017-12-21 NOTE — Discharge Instructions (Addendum)
Please read instructions below. Apply ice to your foot for 20 minutes at a time. Elevate it as much as possible. You can take ibuprofen every 6 hours as needed for pain. You can alternate with tylenol every 4hours as needed. Schedule an appointment with your primary care provider if symptoms persist.  Return to the ER for new or concerning symptoms.

## 2017-12-21 NOTE — ED Notes (Signed)
ED Provider bedside 

## 2017-12-21 NOTE — ED Notes (Signed)
Patient verbalizes understanding of discharge instructions. Opportunity for questioning and answers were provided. Armband removed by staff, pt discharged from ED.  

## 2017-12-21 NOTE — Progress Notes (Signed)
Orthopedic Tech Progress Note Patient Details:  Kyle Farmer 02/08/1991 [id number]  Ortho Devices Type of Ortho Device: Ace wrap, Crutches Ortho Device/Splint Location: rle Ortho Device/Splint Interventions: Application   Post Interventions Patient Tolerated: Well Instructions Provided: Care of device   Nikki Dom 12/21/2017, 7:54 AM

## 2017-12-21 NOTE — ED Triage Notes (Signed)
Patient states that he dropped a television on his foot and believes that it is broken.

## 2017-12-21 NOTE — ED Provider Notes (Signed)
[facility] EMERGENCY DEPARTMENT Provider Note   CSN: [id number] Arrival date & time: 12/21/17  4098     History   Chief Complaint Chief Complaint  Patient presents with  . Foot Injury    right    HPI Kyle Farmer is a 27 y.o. male presenting to the emergency department after dropping a TV in his right foot about 6 hours ago.  He states he had immediate pain to the right foot mostly at the base of the first toe.  Pain is significantly worse with any palpation or movement.  Reports associated swelling.  Denies pain to the ankle.  Smoked marijuana and also took questionable Tylenol pill that he received from a friend, for his pain.  States he did not have any pain to the foot prior to injury.  No other injuries or complaints today.  The history is provided by the patient.    No past medical history on file.  There are no active problems to display for this patient.   No past surgical history on file.      Home Medications    Prior to Admission medications   Medication Sig Start Date End Date Taking? Authorizing Provider  mupirocin ointment (BACTROBAN) 2 % Apply 1 application topically 2 (two) times daily. 09/04/17   Belinda Fisher, PA-C  permethrin (ELIMITE) 5 % cream Apply to affected area once 09/26/17   Alene Mires, NP    Family History Family History  Problem Relation Age of Onset  . Healthy Mother     Social History Social History   Tobacco Use  . Smoking status: Current Some Day Smoker  . Smokeless tobacco: Never Used  Substance Use Topics  . Alcohol use: Yes  . Drug use: Yes    Types: Marijuana     Allergies   Patient has no known allergies.   Review of Systems Review of Systems  Musculoskeletal: Positive for arthralgias and joint swelling.  Skin: Negative for wound.     Physical Exam Updated Vital Signs BP 125/82 (BP Location: Right Arm)   Pulse 82   Temp 98.7 F (37.1 C) (Oral)   Resp 20   Ht 5' 3.25" (1.607 m)    Wt 70.8 kg   SpO2 98%   BMI 27.42 kg/m   Physical Exam  Constitutional: He appears well-developed and well-nourished.  HENT:  Head: Normocephalic and atraumatic.  Eyes: Conjunctivae are normal.  Cardiovascular: Normal rate and intact distal pulses.  Pulmonary/Chest: Effort normal.  Musculoskeletal:       Feet:  TTP to right foot, mostly towards medial aspect. Assoc swelling and mild ecchymosis.  No obvious deformity, no wounds. Normal ROM of ankle. Pt unwilling to range foot 2/t pain. Normal distal sensation  Psychiatric: He has a normal mood and affect. His behavior is normal.  Nursing note and vitals reviewed.    ED Treatments / Results  Labs (all labs ordered are listed, but only abnormal results are displayed) Labs Reviewed - No data to display  EKG None  Radiology Dg Foot Complete Right  Result Date: 12/21/2017 CLINICAL DATA:  Patient dropped TV on foot 6 hours ago, pain and swelling on distal portion of foot. EXAM: RIGHT FOOT COMPLETE - 3+ VIEW COMPARISON:  None. FINDINGS: There is no evidence of fracture or dislocation. There is no evidence of arthropathy or other focal bone abnormality. Soft tissues are unremarkable. IMPRESSION: Negative. Electronically Signed   By: Marisa Cyphers.D.  On: 12/21/2017 06:47    Procedures Procedures (including critical care time)  Medications Ordered in ED Medications  ibuprofen (ADVIL,MOTRIN) tablet 600 mg (has no administration in time range)     Initial Impression / Assessment and Plan / ED Course  I have reviewed the triage vital signs and the nursing notes.  Pertinent labs & imaging results that were available during my care of the patient were reviewed by me and considered in my medical decision making (see chart for details).     Pt with right foot pain after dropping a TV on his foot prior to arrival.  Swelling and faint ecchymosis noted, no obvious deformities.  Neurovascularly intact.  No wounds.  X-ray is  negative for acute fracture.  Pain treated in the ED with ibuprofen.  Will treat for contusion with RICE therapy, Ace wrap for support, crutches.  Discussed NSAIDs and Tylenol for pain at home.  Follow-up as needed.  Safe for discharge.  Discussed results, findings, treatment and follow up. Patient advised of return precautions. Patient verbalized understanding and agreed with plan.  Final Clinical Impressions(s) / ED Diagnoses   Final diagnoses:  Contusion of right foot, initial encounter    ED Discharge Orders    None       Robinson, [place] N, PA-C 12/21/17 2440    Ward, Layla Maw, DO 12/21/17 2314

## 2018-07-20 ENCOUNTER — Encounter (HOSPITAL_COMMUNITY): Payer: Self-pay

## 2018-07-20 ENCOUNTER — Ambulatory Visit (HOSPITAL_COMMUNITY)
Admission: EM | Admit: 2018-07-20 | Discharge: 2018-07-20 | Disposition: A | Discharge status: Home / Self Care | Payer: Self-pay | Attending: Family Medicine | Admitting: Family Medicine

## 2018-07-20 ENCOUNTER — Other Ambulatory Visit: Payer: Self-pay

## 2018-07-20 DIAGNOSIS — Z202 Contact with and (suspected) exposure to infections with a predominantly sexual mode of transmission: Secondary | ICD-10-CM | POA: Insufficient documentation

## 2018-07-20 MED ORDER — CEFTRIAXONE SODIUM 250 MG IJ SOLR
INTRAMUSCULAR | Status: AC
  Filled 2018-07-20: qty 250

## 2018-07-20 MED ORDER — AZITHROMYCIN 250 MG PO TABS
1000.0000 mg | ORAL_TABLET | Freq: Once | ORAL | Status: AC
  Administered 2018-07-20: 11:00:00 1000 mg via ORAL

## 2018-07-20 MED ORDER — CEFTRIAXONE SODIUM 250 MG IJ SOLR
250.0000 mg | Freq: Once | INTRAMUSCULAR | Status: AC
  Administered 2018-07-20: 250 mg via INTRAMUSCULAR

## 2018-07-20 MED ORDER — AZITHROMYCIN 250 MG PO TABS
ORAL_TABLET | ORAL | Status: AC
  Filled 2018-07-20: qty 4

## 2018-07-20 MED ORDER — METRONIDAZOLE 500 MG PO TABS
2000.0000 mg | ORAL_TABLET | Freq: Once | ORAL | Status: AC
  Administered 2018-07-20: 11:00:00 2000 mg via ORAL

## 2018-07-20 MED ORDER — METRONIDAZOLE 500 MG PO TABS
ORAL_TABLET | ORAL | Status: AC
  Filled 2018-07-20: qty 4

## 2018-07-20 NOTE — Discharge Instructions (Signed)
We treated you today for gonorrhea, chlamydia and trichomonas. Sending your urine for testing and blood for HIV and syphilis We will call you with any positive results or you can check your MyChart for results Refrain from sexual activity for 7 days or more.

## 2018-07-20 NOTE — ED Triage Notes (Signed)
Patient presents to Urgent Care with complaints of requesting testing for STDs since receiving a phone call that his sexual partner tested positive for chlamydia. Patient reports he does not have any symptoms but still wants to be treated today.

## 2018-07-20 NOTE — ED Provider Notes (Signed)
[facility]    CSN: [id number] Arrival date & time: 07/20/18  1021     History   Chief Complaint Chief Complaint  Patient presents with  . Exposure to STD    HPI Kyle Farmer is a 28 y.o. male.   Patient is a 28 year old male who presents today for screening for STDs.  Reports that he received a phone call that his sexual partner tested positive for chlamydia.  He is denying any current symptoms today. He would like to be treated and tested for everything.        History reviewed. No pertinent past medical history.  There are no active problems to display for this patient.   History reviewed. No pertinent surgical history.     Home Medications    Prior to Admission medications   Not on File    Family History Family History  Problem Relation Age of Onset  . Healthy Mother     Social History Social History   Tobacco Use  . Smoking status: Current Some Day Smoker  . Smokeless tobacco: Never Used  Substance Use Topics  . Alcohol use: Yes  . Drug use: Yes    Types: Marijuana     Allergies   Patient has no known allergies.   Review of Systems Review of Systems  Genitourinary: Negative for decreased urine volume, difficulty urinating, discharge, dysuria, enuresis, flank pain, frequency, genital sores, hematuria, penile pain, penile swelling, scrotal swelling, testicular pain and urgency.     Physical Exam Triage Vital Signs ED Triage Vitals  Enc Vitals Group     BP 07/20/18 1033 112/69     Pulse Rate 07/20/18 1033 61     Resp 07/20/18 1033 18     Temp 07/20/18 1033 98.1 F (36.7 C)     Temp Source 07/20/18 1033 Oral     SpO2 07/20/18 1033 99 %     Weight --      Height --      Head Circumference --      Peak Flow --      Pain Score 07/20/18 1032 0     Pain Loc --      Pain Edu? --      Excl. in GC? --    No data found.  Updated Vital Signs BP 112/69 (BP Location: Right Arm)   Pulse 61   Temp 98.1 F (36.7 C) (Oral)    Resp 18   SpO2 99%   Visual Acuity Right Eye Distance:   Left Eye Distance:   Bilateral Distance:    Right Eye Near:   Left Eye Near:    Bilateral Near:     Physical Exam Vitals signs and nursing note reviewed.  Constitutional:      Appearance: Normal appearance.  HENT:     Head: Normocephalic and atraumatic.     Nose: Nose normal.  Eyes:     Conjunctiva/sclera: Conjunctivae normal.  Neck:     Musculoskeletal: Normal range of motion.  Pulmonary:     Effort: Pulmonary effort is normal.  Abdominal:     Palpations: Abdomen is soft.     Tenderness: There is no abdominal tenderness.  Musculoskeletal: Normal range of motion.  Skin:    General: Skin is warm and dry.  Neurological:     Mental Status: He is alert.  Psychiatric:        Mood and Affect: Mood normal.      UC Treatments / Results  Labs (all labs ordered are listed, but only abnormal results are displayed) Labs Reviewed  HIV ANTIBODY (ROUTINE TESTING W REFLEX)  RPR  URINE CYTOLOGY ANCILLARY ONLY    EKG None  Radiology No results found.  Procedures Procedures (including critical care time)  Medications Ordered in UC Medications  cefTRIAXone (ROCEPHIN) injection 250 mg (250 mg Intramuscular Given 07/20/18 1106)  azithromycin (ZITHROMAX) tablet 1,000 mg (1,000 mg Oral Given 07/20/18 1105)  metroNIDAZOLE (FLAGYL) tablet 2,000 mg (2,000 mg Oral Given 07/20/18 1105)    Initial Impression / Assessment and Plan / UC Course  I have reviewed the triage vital signs and the nursing notes.  Pertinent labs & imaging results that were available during my care of the patient were reviewed by me and considered in my medical decision making (see chart for details).    STD exposure    Tested for STDs today with labs pending. Treated prophylactically for gonorrhea, chlamydia and trichomonas  Final Clinical Impressions(s) / UC Diagnoses   Final diagnoses:  STD exposure     Discharge Instructions     We  treated you today for gonorrhea, chlamydia and trichomonas. Sending your urine for testing and blood for HIV and syphilis We will call you with any positive results or you can check your MyChart for results Refrain from sexual activity for 7 days or more.    ED Prescriptions    None     Controlled Substance Prescriptions [place] Controlled Substance Registry consulted? Not Applicable   Janace Aris, NP 07/20/18 1118

## 2018-07-21 LAB — URINE CYTOLOGY ANCILLARY ONLY
Chlamydia: NEGATIVE
Neisseria Gonorrhea: NEGATIVE
Trichomonas: NEGATIVE

## 2018-07-21 LAB — HIV ANTIBODY (ROUTINE TESTING W REFLEX): HIV Screen 4th Generation wRfx: NONREACTIVE

## 2018-07-21 LAB — RPR: RPR Ser Ql: NONREACTIVE

## 2019-01-11 ENCOUNTER — Other Ambulatory Visit: Payer: Self-pay

## 2019-01-11 ENCOUNTER — Encounter (HOSPITAL_COMMUNITY): Payer: Self-pay

## 2019-01-11 ENCOUNTER — Ambulatory Visit (HOSPITAL_COMMUNITY)
Admission: EM | Admit: 2019-01-11 | Discharge: 2019-01-11 | Disposition: A | Discharge status: Home / Self Care | Payer: Self-pay | Attending: Family Medicine | Admitting: Family Medicine

## 2019-01-11 DIAGNOSIS — R369 Urethral discharge, unspecified: Secondary | ICD-10-CM | POA: Insufficient documentation

## 2019-01-11 MED ORDER — LIDOCAINE HCL (PF) 1 % IJ SOLN
INTRAMUSCULAR | Status: AC
  Filled 2019-01-11: qty 2

## 2019-01-11 MED ORDER — AZITHROMYCIN 250 MG PO TABS
1000.0000 mg | ORAL_TABLET | Freq: Once | ORAL | Status: AC
  Administered 2019-01-11: 10:00:00 1000 mg via ORAL

## 2019-01-11 MED ORDER — AZITHROMYCIN 250 MG PO TABS
ORAL_TABLET | ORAL | Status: AC
  Filled 2019-01-11: qty 4

## 2019-01-11 MED ORDER — CEFTRIAXONE SODIUM 250 MG IJ SOLR
250.0000 mg | Freq: Once | INTRAMUSCULAR | Status: AC
  Administered 2019-01-11: 250 mg via INTRAMUSCULAR

## 2019-01-11 MED ORDER — CEFTRIAXONE SODIUM 250 MG IJ SOLR
INTRAMUSCULAR | Status: AC
  Filled 2019-01-11: qty 250

## 2019-01-11 NOTE — ED Triage Notes (Signed)
Pt presents for STD testing; pt states he is not having any symptoms. 

## 2019-01-11 NOTE — ED Provider Notes (Signed)
[facility]    CSN: [id number] Arrival date & time: 01/11/19  0908      History   Chief Complaint Chief Complaint  Patient presents with  . STD Testing    HPI Kyle Farmer is a 28 y.o. male.   HPI  Patient states that he is here for STD testing.  Initially denies symptoms, but then states that he thinks he has some dysuria.  He has not noticed any discharge.  He has multiple sexual partners does not use condoms.  He does not want blood work against my advice.  He would like treatment while he is here.  History reviewed. No pertinent past medical history.  There are no active problems to display for this patient.   Past Surgical History:  Procedure Laterality Date  . DENTAL SURGERY         Home Medications    Prior to Admission medications   Not on File    Family History Family History  Problem Relation Age of Onset  . Healthy Mother     Social History Social History   Tobacco Use  . Smoking status: Current Some Day Smoker  . Smokeless tobacco: Never Used  Substance Use Topics  . Alcohol use: Yes  . Drug use: Yes    Types: Marijuana     Allergies   Patient has no known allergies.   Review of Systems Review of Systems  Constitutional: Negative for chills and fever.  HENT: Negative for ear pain and sore throat.   Eyes: Negative for pain and visual disturbance.  Respiratory: Negative for cough and shortness of breath.   Cardiovascular: Negative for chest pain and palpitations.  Gastrointestinal: Negative for abdominal pain and vomiting.  Genitourinary: Positive for dysuria. Negative for hematuria.  Musculoskeletal: Negative for arthralgias and back pain.  Skin: Negative for color change and rash.  Neurological: Negative for seizures and syncope.  All other systems reviewed and are negative.    Physical Exam Triage Vital Signs ED Triage Vitals  Enc Vitals Group     BP 01/11/19 0953 119/88     Pulse Rate 01/11/19 0953 67   Resp 01/11/19 0953 18     Temp 01/11/19 0953 98.6 F (37 C)     Temp Source 01/11/19 0953 Oral     SpO2 01/11/19 0953 99 %     Weight --      Height --      Head Circumference --      Peak Flow --      Pain Score 01/11/19 0955 0     Pain Loc --      Pain Edu? --      Excl. in [place]? --    No data found.  Updated Vital Signs BP 119/88 (BP Location: Right Arm)   Pulse 67   Temp 98.6 F (37 C) (Oral)   Resp 18   SpO2 99%      Physical Exam Constitutional:      General: He is not in acute distress.    Appearance: He is well-developed.     Comments: Appears healthy  HENT:     Head: Normocephalic and atraumatic.     Mouth/Throat:     Comments: Mask in place Eyes:     Conjunctiva/sclera: Conjunctivae normal.     Pupils: Pupils are equal, round, and reactive to light.  Neck:     Musculoskeletal: Normal range of motion.  Cardiovascular:     Rate and Rhythm:  Normal rate.  Pulmonary:     Effort: Pulmonary effort is normal. No respiratory distress.  Abdominal:     General: There is no distension.     Palpations: Abdomen is soft.  Musculoskeletal: Normal range of motion.  Skin:    General: Skin is warm and dry.  Neurological:     Mental Status: He is alert.  Psychiatric:        Mood and Affect: Mood normal.        Behavior: Behavior normal.      UC Treatments / Results  Labs (all labs ordered are listed, but only abnormal results are displayed) Labs Reviewed  CYTOLOGY, (ORAL, ANAL, URETHRAL) ANCILLARY ONLY    EKG   Radiology No results found.  Procedures Procedures (including critical care time)  Medications Ordered in UC Medications  azithromycin (ZITHROMAX) tablet 1,000 mg (1,000 mg Oral Given 01/11/19 1029)  cefTRIAXone (ROCEPHIN) injection 250 mg (250 mg Intramuscular Given 01/11/19 1029)  azithromycin (ZITHROMAX) 250 MG tablet (has no administration in time range)  cefTRIAXone (ROCEPHIN) 250 MG injection (has no administration in time range)   lidocaine (PF) (XYLOCAINE) 1 % injection (has no administration in time range)    Initial Impression / Assessment and Plan / UC Course  I have reviewed the triage vital signs and the nursing notes.  Pertinent labs & imaging results that were available during my care of the patient were reviewed by me and considered in my medical decision making (see chart for details).     Safe sex discussed Final Clinical Impressions(s) / UC Diagnoses   Final diagnoses:  Penile discharge     Discharge Instructions     AVOID SEXUAL RELATIONS FOR 7 DAYS SAFE SEX IS RECOMMENDATION.   ED Prescriptions    None     PDMP not reviewed this encounter.   Raylene Everts, MD 01/11/19 1039

## 2019-01-11 NOTE — Discharge Instructions (Signed)
AVOID SEXUAL RELATIONS FOR 7 DAYS SAFE SEX IS RECOMMENDATION.

## 2019-01-12 LAB — CYTOLOGY, (ORAL, ANAL, URETHRAL) ANCILLARY ONLY
Chlamydia: NEGATIVE
Neisseria Gonorrhea: NEGATIVE
Trichomonas: NEGATIVE

## 2019-03-21 ENCOUNTER — Encounter (HOSPITAL_COMMUNITY): Payer: Self-pay

## 2019-03-21 ENCOUNTER — Ambulatory Visit (HOSPITAL_COMMUNITY)
Admission: EM | Admit: 2019-03-21 | Discharge: 2019-03-21 | Disposition: A | Discharge status: Home / Self Care | Payer: Self-pay | Attending: Family Medicine | Admitting: Family Medicine

## 2019-03-21 ENCOUNTER — Other Ambulatory Visit: Payer: Self-pay

## 2019-03-21 DIAGNOSIS — Z202 Contact with and (suspected) exposure to infections with a predominantly sexual mode of transmission: Secondary | ICD-10-CM | POA: Insufficient documentation

## 2019-03-21 DIAGNOSIS — Z113 Encounter for screening for infections with a predominantly sexual mode of transmission: Secondary | ICD-10-CM | POA: Insufficient documentation

## 2019-03-21 MED ORDER — AZITHROMYCIN 250 MG PO TABS
ORAL_TABLET | ORAL | Status: AC
  Filled 2019-03-21: qty 4

## 2019-03-21 MED ORDER — AZITHROMYCIN 250 MG PO TABS
1000.0000 mg | ORAL_TABLET | Freq: Once | ORAL | Status: AC
  Administered 2019-03-21: 1000 mg via ORAL

## 2019-03-21 NOTE — Discharge Instructions (Signed)
We have treated you today for chlamydia.  Will notify of any positive findings from your testing and if any changes to treatment are needed.  You may monitor your results on your MyChart online as well.   Please withhold from intercourse for the next week. Please use condoms to prevent STD's.

## 2019-03-21 NOTE — ED Provider Notes (Signed)
[facility]    CSN: [id number] Arrival date & time: 03/21/19  1801      History   Chief Complaint Chief Complaint  Patient presents with  . SEXUALLY TRANSMITTED DISEASE    HPI Kyle Farmer is a 29 y.o. male.   Kyle Farmer presents with complaints of known exposure to chlamydia. His male partner called him today and notified him of her positive testing. He denies any symptoms of chlamydia. They do not use condoms. This is his only partner. Denies any previous stds.    ROS per HPI, negative if not otherwise mentioned.      History reviewed. No pertinent past medical history.  There are no problems to display for this patient.   Past Surgical History:  Procedure Laterality Date  . DENTAL SURGERY         Home Medications    Prior to Admission medications   Not on File    Family History Family History  Problem Relation Age of Onset  . Healthy Mother     Social History Social History   Tobacco Use  . Smoking status: Current Some Day Smoker  . Smokeless tobacco: Never Used  Substance Use Topics  . Alcohol use: Yes  . Drug use: Yes    Types: Marijuana     Allergies   Patient has no known allergies.   Review of Systems Review of Systems   Physical Exam Triage Vital Signs ED Triage Vitals  Enc Vitals Group     BP 03/21/19 1833 (!) 121/98     Pulse Rate 03/21/19 1833 88     Resp 03/21/19 1833 16     Temp 03/21/19 1833 98.7 F (37.1 C)     Temp Source 03/21/19 1833 Oral     SpO2 03/21/19 1833 100 %     Weight --      Height --      Head Circumference --      Peak Flow --      Pain Score 03/21/19 1832 0     Pain Loc --      Pain Edu? --      Excl. in [place]? --    No data found.  Updated Vital Signs BP (!) 121/98 (BP Location: Right Arm)   Pulse 88   Temp 98.7 F (37.1 C) (Oral)   Resp 16   SpO2 100%    Physical Exam Constitutional:      Appearance: He is well-developed.  Cardiovascular:     Rate and Rhythm:  Normal rate.  Pulmonary:     Effort: Pulmonary effort is normal.  Abdominal:     Palpations: Abdomen is soft. Abdomen is not rigid.     Tenderness: There is no abdominal tenderness. There is no guarding or rebound. Negative signs include Murphy's sign and McBurney's sign.     Comments: Denies scrotal redness, swelling, pain; denies sores or lesions; gu exam deferred   Skin:    General: Skin is warm and dry.  Neurological:     Mental Status: He is alert and oriented to person, place, and time.      UC Treatments / Results  Labs (all labs ordered are listed, but only abnormal results are displayed) Labs Reviewed  CYTOLOGY, (ORAL, ANAL, URETHRAL) ANCILLARY ONLY    EKG   Radiology No results found.  Procedures Procedures (including critical care time)  Medications Ordered in UC Medications  azithromycin (ZITHROMAX) tablet 1,000 mg (1,000 mg Oral Given  03/21/19 1857)    Initial Impression / Assessment and Plan / UC Course  I have reviewed the triage vital signs and the nursing notes.  Pertinent labs & imaging results that were available during my care of the patient were reviewed by me and considered in my medical decision making (see chart for details).     Empiric azithromycin for chlamydia provided here tonight with penile cytology pending. Safe sex encouraged.  Final Clinical Impressions(s) / UC Diagnoses   Final diagnoses:  Exposure to chlamydia  Screen for STD (sexually transmitted disease)     Discharge Instructions     We have treated you today for chlamydia.  Will notify of any positive findings from your testing and if any changes to treatment are needed.  You may monitor your results on your MyChart online as well.   Please withhold from intercourse for the next week. Please use condoms to prevent STD's.      ED Prescriptions    None     PDMP not reviewed this encounter.   Georgetta Haber, NP 03/21/19 1900

## 2019-03-21 NOTE — ED Triage Notes (Signed)
Patient presents to Urgent Care with complaints of exposure to chlamydia since he got a call today saying he was exposed. Patient reports he is not having any sx, would like to be treated today.

## 2019-03-23 LAB — CYTOLOGY, (ORAL, ANAL, URETHRAL) ANCILLARY ONLY
Chlamydia: NEGATIVE
Neisseria Gonorrhea: NEGATIVE
Trichomonas: NEGATIVE

## 2019-07-31 ENCOUNTER — Other Ambulatory Visit: Payer: Self-pay

## 2019-07-31 ENCOUNTER — Encounter (HOSPITAL_COMMUNITY): Payer: Self-pay

## 2019-07-31 ENCOUNTER — Ambulatory Visit (HOSPITAL_COMMUNITY)
Admission: EM | Admit: 2019-07-31 | Discharge: 2019-07-31 | Disposition: A | Discharge status: Home / Self Care | Payer: HRSA Program | Attending: Internal Medicine | Admitting: Internal Medicine

## 2019-07-31 DIAGNOSIS — F17200 Nicotine dependence, unspecified, uncomplicated: Secondary | ICD-10-CM | POA: Diagnosis not present

## 2019-07-31 DIAGNOSIS — Z20822 Contact with and (suspected) exposure to covid-19: Secondary | ICD-10-CM | POA: Insufficient documentation

## 2019-07-31 DIAGNOSIS — R109 Unspecified abdominal pain: Secondary | ICD-10-CM | POA: Insufficient documentation

## 2019-07-31 DIAGNOSIS — R110 Nausea: Secondary | ICD-10-CM | POA: Diagnosis not present

## 2019-07-31 DIAGNOSIS — K5900 Constipation, unspecified: Secondary | ICD-10-CM | POA: Insufficient documentation

## 2019-07-31 LAB — SARS CORONAVIRUS 2 (TAT 6-24 HRS): SARS Coronavirus 2: NEGATIVE

## 2019-07-31 NOTE — ED Provider Notes (Signed)
[facility]    CSN: [id number] Arrival date & time: 07/31/19  1005      History   Chief Complaint Chief Complaint  Patient presents with  . Abdominal Pain    HPI Kyle Farmer is a 29 y.o. male otherwise healthy reports to urgent care today complaints of abdominal pain and nausea.  Patient states he came in because employer asked for a note for him to return to work.  States at work yesterday he was experiencing some abdominal discomfort prompting him to need to use restroom several times.  Patient states he feels like he needs to have a bowel movement, but only able to go a small amount.  Describes stool as hard.  Denies any vomiting, no hematochezia, no congestion, no chest pain or shortness of breath.  States someone at work checked his temperature yesterday and stated he had a fever.  No fever or chills since then.  No recent medications.  Tolerating p.o.'s well and drinking soda prior to exam.  HPI  History reviewed. No pertinent past medical history.  There are no problems to display for this patient.   Past Surgical History:  Procedure Laterality Date  . DENTAL SURGERY         Home Medications    Prior to Admission medications   Not on File    Family History Family History  Problem Relation Age of Onset  . Healthy Mother     Social History Social History   Tobacco Use  . Smoking status: Current Some Day Smoker  . Smokeless tobacco: Never Used  Vaping Use  . Vaping Use: Never used  Substance Use Topics  . Alcohol use: Yes  . Drug use: Yes    Types: Marijuana     Allergies   Patient has no known allergies.   Review of Systems In HPI otherwise negative   Physical Exam Triage Vital Signs ED Triage Vitals [07/31/19 1030]  Enc Vitals Group     BP 124/80     Pulse Rate 71     Resp 18     Temp 98.4 F (36.9 C)     Temp Source Tympanic     SpO2 100 %     Weight      Height      Head Circumference      Peak Flow      Pain  Score 5     Pain Loc      Pain Edu?      Excl. in [place]?    No data found.  Updated Vital Signs BP 124/80 (BP Location: Right Arm)   Pulse 71   Temp 98.4 F (36.9 C) (Tympanic)   Resp 18   SpO2 100%      Physical Exam General: Well-nourished well-developed in no acute distress CV: S1-S2 RRR, no murmur rub or gallop Resp: CTAB, no wheezes rales or crackles, no increased work of breathing GI: Abdomen is soft with positive bowel sounds, negative McBurney sign, negative Murphy sign Psych: AAOx3, pleasant mood and affect  UC Treatments / Results  Labs (all labs ordered are listed, but only abnormal results are displayed) Labs Reviewed  SARS CORONAVIRUS 2 (TAT 6-24 HRS)    EKG   Radiology No results found.   Medications Ordered in UC Medications - No data to display  Initial Impression / Assessment and Plan / UC Course  I have reviewed the triage vital signs and the nursing notes.  Pertinent labs &  imaging results that were available during my care of the patient were reviewed by me and considered in my medical decision making (see chart for details).   1. Abdominal pain -Exam and history consistent with constipation -No significant findings on physical exam with nonsurgical abdomen, no fever and nontoxic appearing -Patient instructed to take MiraLAX daily until symptoms of constipation resolved and normal bowel movement  Final Clinical Impressions(s) / UC Diagnoses   Final diagnoses:  Constipation, unspecified constipation type     Discharge Instructions     You can take Miralax 17g daily until constipation has resolved.    ED Prescriptions    None     PDMP not reviewed this encounter.   Rolla Etienne, NP 07/31/19 1058

## 2019-07-31 NOTE — Discharge Instructions (Signed)
You can take Miralax 17g daily until constipation has resolved.

## 2019-07-31 NOTE — ED Triage Notes (Signed)
Pt presents today after right lower quadrant to umbilical and feels constipated. Pt still has apendix. Tmax of 101 at work yesterday. Pt needs negative covid results to return to work. Pt endorses nausea after eating. Pt denies runny nose, cough, sore throat, headache, body ache. Pt denies OTC meds. Pt states he has "not been eating to treat pain".

## 2020-01-08 ENCOUNTER — Other Ambulatory Visit: Payer: Self-pay

## 2020-01-08 ENCOUNTER — Ambulatory Visit (HOSPITAL_COMMUNITY)
Admission: EM | Admit: 2020-01-08 | Discharge: 2020-01-08 | Disposition: A | Discharge status: Home / Self Care | Payer: Self-pay | Attending: Urgent Care | Admitting: Urgent Care

## 2020-01-08 ENCOUNTER — Encounter (HOSPITAL_COMMUNITY): Payer: Self-pay | Admitting: *Deleted

## 2020-01-08 DIAGNOSIS — Z202 Contact with and (suspected) exposure to infections with a predominantly sexual mode of transmission: Secondary | ICD-10-CM | POA: Insufficient documentation

## 2020-01-08 DIAGNOSIS — Z20822 Contact with and (suspected) exposure to covid-19: Secondary | ICD-10-CM | POA: Insufficient documentation

## 2020-01-08 LAB — HIV ANTIBODY (ROUTINE TESTING W REFLEX): HIV Screen 4th Generation wRfx: NONREACTIVE

## 2020-01-08 MED ORDER — DOXYCYCLINE HYCLATE 100 MG PO CAPS: 100.0000 mg | ORAL_CAPSULE | Freq: Two times a day (BID) | ORAL | 0 refills | Status: DC

## 2020-01-08 MED ORDER — CEFTRIAXONE SODIUM 500 MG IJ SOLR
INTRAMUSCULAR | Status: AC
  Filled 2020-01-08: qty 500

## 2020-01-08 MED ORDER — LIDOCAINE HCL (PF) 1 % IJ SOLN
INTRAMUSCULAR | Status: AC
  Filled 2020-01-08: qty 2

## 2020-01-08 MED ORDER — CEFTRIAXONE SODIUM 500 MG IJ SOLR
500.0000 mg | Freq: Once | INTRAMUSCULAR | Status: AC
  Administered 2020-01-08: 500 mg via INTRAMUSCULAR

## 2020-01-08 NOTE — ED Provider Notes (Signed)
  [facility]   MRN: [id number] DOB: August 07, 1990  Subjective:   Kyle Farmer is a 29 y.o. male presenting for COVID 19 testing and STI tx. Patient has a male sex partner that tested positive for gonorrhea and chlamydia. Denies dysuria, hematuria, urinary frequency, penile discharge, penile swelling, testicular pain, testicular swelling, anal pain, groin pain. Would like complete testing and treatment given his exposure.   No current facility-administered medications for this encounter. No current outpatient medications on file.   No Known Allergies  History reviewed. No pertinent past medical history.   Past Surgical History:  Procedure Laterality Date  . DENTAL SURGERY      Family History  Problem Relation Age of Onset  . Healthy Mother     Social History   Tobacco Use  . Smoking status: Current Some Day Smoker  . Smokeless tobacco: Never Used  Vaping Use  . Vaping Use: Never used  Substance Use Topics  . Alcohol use: Yes  . Drug use: Yes    Types: Marijuana    ROS   Objective:   Vitals: BP 128/86 (BP Location: Left Arm)   Pulse 67   Temp 98.2 F (36.8 C) (Oral)   Resp 15   Ht 5' 3.25" (1.607 m)   Wt 140 lb (63.5 kg)   BMI 24.60 kg/m   Physical Exam Constitutional:      General: He is not in acute distress.    Appearance: Normal appearance. He is well-developed and normal weight. He is not ill-appearing, toxic-appearing or diaphoretic.  HENT:     Head: Normocephalic and atraumatic.     Right Ear: External ear normal.     Left Ear: External ear normal.     Nose: Nose normal.     Mouth/Throat:     Pharynx: Oropharynx is clear.  Eyes:     General: No scleral icterus.       Right eye: No discharge.        Left eye: No discharge.     Extraocular Movements: Extraocular movements intact.     Pupils: Pupils are equal, round, and reactive to light.  Cardiovascular:     Rate and Rhythm: Normal rate.  Pulmonary:     Effort:  Pulmonary effort is normal.  Musculoskeletal:     Cervical back: Normal range of motion.  Neurological:     Mental Status: He is alert and oriented to person, place, and time.  Psychiatric:        Mood and Affect: Mood normal.        Behavior: Behavior normal.        Thought Content: Thought content normal.        Judgment: Judgment normal.       Assessment and Plan :   PDMP not reviewed this encounter.  1. Exposure to STD   2. Exposure to COVID-19 virus     Patient treated empirically as per CDC guidelines with IM ceftriaxone, doxycycline as an outpatient.  Labs pending.   Counseled on safe sex practices including abstaining for 1 week following treatment.  COVID 19 testing pending. Counseled patient on potential for adverse effects with medications prescribed/recommended today, ER and return-to-clinic precautions discussed, patient verbalized understanding.    Wallis Bamberg, PA-C 01/08/20 1640

## 2020-01-08 NOTE — Discharge Instructions (Addendum)
Avoid all forms of sexual intercourse (oral, vaginal, anal) for the next 7 days to avoid spreading/reinfecting. Return if symptoms worsen/do not resolve, you develop fever, abdominal pain, blood in your urine, or are re-exposed to an STI.  

## 2020-01-08 NOTE — ED Triage Notes (Signed)
PT denies any Sx's but reports he was contacted by someone reporting to be positive for SDT . Pt also reports he wants a COVID  Test . Pt has no Sx's.

## 2020-01-09 LAB — SARS CORONAVIRUS 2 (TAT 6-24 HRS): SARS Coronavirus 2: NEGATIVE

## 2020-01-09 LAB — RPR: RPR Ser Ql: NONREACTIVE

## 2020-01-11 LAB — CYTOLOGY, (ORAL, ANAL, URETHRAL) ANCILLARY ONLY
Chlamydia: NEGATIVE
Comment: NEGATIVE
Comment: NEGATIVE
Comment: NORMAL
Trichomonas: NEGATIVE

## 2020-01-16 ENCOUNTER — Other Ambulatory Visit: Payer: Self-pay

## 2020-01-16 ENCOUNTER — Ambulatory Visit (HOSPITAL_COMMUNITY)
Admission: EM | Admit: 2020-01-16 | Discharge: 2020-01-16 | Disposition: A | Discharge status: Home / Self Care | Payer: Self-pay | Attending: Internal Medicine | Admitting: Internal Medicine

## 2020-01-16 DIAGNOSIS — Z202 Contact with and (suspected) exposure to infections with a predominantly sexual mode of transmission: Secondary | ICD-10-CM | POA: Insufficient documentation

## 2020-01-16 NOTE — ED Triage Notes (Signed)
Patient presents to [facility] for reswab of recent cytology test due to insufficient sample for testing

## 2020-01-17 LAB — CYTOLOGY, (ORAL, ANAL, URETHRAL) ANCILLARY ONLY
Chlamydia: NEGATIVE
Comment: NEGATIVE
Comment: NEGATIVE
Comment: NORMAL
Neisseria Gonorrhea: NEGATIVE
Trichomonas: POSITIVE — AB

## 2020-01-18 ENCOUNTER — Telehealth (HOSPITAL_COMMUNITY): Payer: Self-pay | Admitting: Emergency Medicine

## 2020-01-18 MED ORDER — METRONIDAZOLE 500 MG PO TABS: 500.0000 mg | ORAL_TABLET | Freq: Two times a day (BID) | ORAL | 0 refills | Status: DC

## 2020-04-22 ENCOUNTER — Ambulatory Visit (HOSPITAL_COMMUNITY)
Admission: EM | Admit: 2020-04-22 | Discharge: 2020-04-22 | Disposition: A | Discharge status: Home / Self Care | Payer: Self-pay | Attending: Emergency Medicine | Admitting: Emergency Medicine

## 2020-04-22 ENCOUNTER — Other Ambulatory Visit: Payer: Self-pay

## 2020-04-22 ENCOUNTER — Encounter (HOSPITAL_COMMUNITY): Payer: Self-pay | Admitting: Emergency Medicine

## 2020-04-22 DIAGNOSIS — Z202 Contact with and (suspected) exposure to infections with a predominantly sexual mode of transmission: Secondary | ICD-10-CM | POA: Insufficient documentation

## 2020-04-22 DIAGNOSIS — L0291 Cutaneous abscess, unspecified: Secondary | ICD-10-CM | POA: Insufficient documentation

## 2020-04-22 LAB — RAPID HIV SCREEN (HIV 1/2 AB+AG)
HIV 1/2 Antibodies: NONREACTIVE
HIV-1 P24 Antigen - HIV24: NONREACTIVE

## 2020-04-22 LAB — HIV ANTIBODY (ROUTINE TESTING W REFLEX): HIV Screen 4th Generation wRfx: NONREACTIVE

## 2020-04-22 MED ORDER — DOXYCYCLINE HYCLATE 100 MG PO CAPS: 100.0000 mg | ORAL_CAPSULE | Freq: Two times a day (BID) | ORAL | 0 refills | Status: AC

## 2020-04-22 MED ORDER — METRONIDAZOLE 500 MG PO TABS: 500.0000 mg | ORAL_TABLET | Freq: Two times a day (BID) | ORAL | 0 refills | Status: AC

## 2020-04-22 NOTE — Discharge Instructions (Addendum)
Swab testing will be back in about 3 days. We will be in touch with abnormal results that require further treatment  I have sent in metronidazole for you to take twice a day for 7 days. Do not drink alcohol with this medication.  Do not have sex for 7 days to give the medication time to clear the infection  If you do have sex, wear a condom so that you do not spread anything to anyone else  Follow up with this office or with primary care if symptoms are persisting.  Follow up in the ER for high fever, trouble swallowing, trouble breathing, other concerning symptoms.   Take the doxycycline 1 pill twice a day for 10 days to clear up the abscess on your chest.   Apply a warm compress to the area as needed.  If the area gets larger or you develop any fevers, please return to be re-evaluated.

## 2020-04-22 NOTE — ED Provider Notes (Signed)
[facility]    CSN: [id number] Arrival date & time: 04/22/20  1114      History   Chief Complaint Chief Complaint  Patient presents with  . Insect Bite  . Exposure to STD    HPI Kyle Farmer is a 30 y.o. male.   Kyle Farmer is a 30 year old with complaint of abscess to right chest and exposure to STD.  Reports first noticed swelling, redness and pain proximal to right nipple about 2 days ago.  Reports attempting to "pop" it but nothing came out.  Used lotions and warm compresses with minimal relief.  Reports being notified by a sexual partner that the partner was diagnosed with trichomonas.  Denies any discharge or itching.    The history is provided by the patient.  Exposure to STD Pertinent negatives include no chest pain, no abdominal pain, no headaches and no shortness of breath.    History reviewed. No pertinent past medical history.  There are no problems to display for this patient.   Past Surgical History:  Procedure Laterality Date  . DENTAL SURGERY         Home Medications    Prior to Admission medications   Medication Sig Start Date End Date Taking? Authorizing Provider  doxycycline (VIBRAMYCIN) 100 MG capsule Take 1 capsule (100 mg total) by mouth 2 (two) times daily. 04/22/20  Yes Ivette Loyal, NP  metroNIDAZOLE (FLAGYL) 500 MG tablet Take 1 tablet (500 mg total) by mouth 2 (two) times daily for 7 days. 04/22/20 04/29/20 Yes Ivette Loyal, NP    Family History Family History  Problem Relation Age of Onset  . Healthy Mother     Social History Social History   Tobacco Use  . Smoking status: Current Some Day Smoker  . Smokeless tobacco: Never Used  Vaping Use  . Vaping Use: Never used  Substance Use Topics  . Alcohol use: Yes  . Drug use: Yes    Types: Marijuana     Allergies   Patient has no known allergies.   Review of Systems Review of Systems  Respiratory: Negative for shortness of breath.   Cardiovascular:  Negative for chest pain.  Gastrointestinal: Negative for abdominal pain.  Genitourinary: Negative for difficulty urinating, genital sores, hematuria, penile discharge, penile pain, penile swelling, scrotal swelling, testicular pain and urgency.  Skin: Positive for wound.  Neurological: Negative for headaches.     Physical Exam Triage Vital Signs ED Triage Vitals  Enc Vitals Group     BP 04/22/20 1139 (!) 135/105     Pulse Rate 04/22/20 1139 69     Resp 04/22/20 1139 18     Temp 04/22/20 1139 98.5 F (36.9 C)     Temp Source 04/22/20 1139 Oral     SpO2 04/22/20 1139 97 %     Weight --      Height --      Head Circumference --      Peak Flow --      Pain Score 04/22/20 1137 8     Pain Loc --      Pain Edu? --      Excl. in [place]? --    No data found.  Updated Vital Signs BP (!) 135/105 (BP Location: Right Arm)   Pulse 69   Temp 98.5 F (36.9 C) (Oral)   Resp 18   SpO2 97%   Visual Acuity Right Eye Distance:   Left Eye Distance:  Bilateral Distance:    Right Eye Near:   Left Eye Near:    Bilateral Near:     Physical Exam Vitals and nursing note reviewed.  Constitutional:      Appearance: He is well-developed and well-nourished.  HENT:     Head: Normocephalic and atraumatic.  Eyes:     Conjunctiva/sclera: Conjunctivae normal.  Cardiovascular:     Rate and Rhythm: Normal rate and regular rhythm.     Pulses: Normal pulses.  Pulmonary:     Effort: Pulmonary effort is normal.  Musculoskeletal:        General: No edema. Normal range of motion.     Cervical back: Neck supple.  Skin:    General: Skin is warm and dry.     Findings: Abscess present.          Comments: Abscess proximal to right nipple with approximately 1cm of induration  Neurological:     General: No focal deficit present.     Mental Status: He is alert and oriented to person, place, and time.  Psychiatric:        Mood and Affect: Mood and affect and mood normal.      UC Treatments /  Results  Labs (all labs ordered are listed, but only abnormal results are displayed) Labs Reviewed  HIV ANTIBODY (ROUTINE TESTING W REFLEX)  RAPID HIV SCREEN (HIV 1/2 AB+AG)  CYTOLOGY, (ORAL, ANAL, URETHRAL) ANCILLARY ONLY    EKG   Radiology No results found.  Procedures Procedures (including critical care time)  Medications Ordered in UC Medications - No data to display  Initial Impression / Assessment and Plan / UC Course  I have reviewed the triage vital signs and the nursing notes.  Pertinent labs & imaging results that were available during my care of the patient were reviewed by me and considered in my medical decision making (see chart for details).     Abscess 1. Doxycycline prescribed 2. Warm compresses for comfort  3. Signs of worsening infection  STD exposure 1. Flagyl prescribed  3. Self swab and HIV/RPR tests pending 2. Safe sex practices and notify all sexual partners  Final Clinical Impressions(s) / UC Diagnoses   Final diagnoses:  Abscess  STD exposure     Discharge Instructions     Swab testing will be back in about 3 days. We will be in touch with abnormal results that require further treatment  I have sent in metronidazole for you to take twice a day for 7 days. Do not drink alcohol with this medication.  Do not have sex for 7 days to give the medication time to clear the infection  If you do have sex, wear a condom so that you do not spread anything to anyone else  Follow up with this office or with primary care if symptoms are persisting.  Follow up in the ER for high fever, trouble swallowing, trouble breathing, other concerning symptoms.   Take the doxycycline 1 pill twice a day for 10 days to clear up the abscess on your chest.   Apply a warm compress to the area as needed.  If the area gets larger or you develop any fevers, please return to be re-evaluated.     ED Prescriptions    Medication Sig Dispense Auth. Provider    metroNIDAZOLE (FLAGYL) 500 MG tablet Take 1 tablet (500 mg total) by mouth 2 (two) times daily for 7 days. 14 tablet Ivette Loyal, NP   doxycycline (VIBRAMYCIN) 100  MG capsule Take 1 capsule (100 mg total) by mouth 2 (two) times daily. 20 capsule Ivette Loyal, NP     PDMP not reviewed this encounter.   Ivette Loyal, NP 04/22/20 1218

## 2020-04-22 NOTE — ED Triage Notes (Signed)
Pt presents with possible spider bite on right nipple. States very sensitive to touch. Also would like to get tested for STD after recent exposure to Trich. Denies any symptoms at this time.

## 2020-04-23 LAB — CYTOLOGY, (ORAL, ANAL, URETHRAL) ANCILLARY ONLY
Chlamydia: NEGATIVE
Comment: NEGATIVE
Comment: NEGATIVE
Comment: NORMAL
Neisseria Gonorrhea: NEGATIVE
Trichomonas: POSITIVE — AB

## 2023-06-28 ENCOUNTER — Emergency Department (HOSPITAL_COMMUNITY): Payer: Self-pay

## 2023-06-28 ENCOUNTER — Encounter (HOSPITAL_COMMUNITY): Payer: Self-pay | Admitting: *Deleted

## 2023-06-28 ENCOUNTER — Emergency Department (HOSPITAL_COMMUNITY)
Admission: EM | Admit: 2023-06-28 | Discharge: 2023-06-29 | Disposition: A | Discharge status: Home / Self Care | Payer: Self-pay | Attending: Emergency Medicine | Admitting: Emergency Medicine

## 2023-06-28 ENCOUNTER — Other Ambulatory Visit: Payer: Self-pay

## 2023-06-28 DIAGNOSIS — M545 Low back pain, unspecified: Secondary | ICD-10-CM | POA: Insufficient documentation

## 2023-06-28 MED ORDER — PREDNISONE 20 MG PO TABS
60.0000 mg | ORAL_TABLET | Freq: Once | ORAL | Status: AC
  Administered 2023-06-28: 60 mg via ORAL
  Filled 2023-06-28: qty 3

## 2023-06-28 MED ORDER — METHOCARBAMOL 500 MG PO TABS
500.0000 mg | ORAL_TABLET | Freq: Once | ORAL | Status: AC
  Administered 2023-06-28: 500 mg via ORAL
  Filled 2023-06-28: qty 1

## 2023-06-28 MED ORDER — KETOROLAC TROMETHAMINE 15 MG/ML IJ SOLN
15.0000 mg | Freq: Once | INTRAMUSCULAR | Status: AC
  Administered 2023-06-28: 15 mg via INTRAMUSCULAR
  Filled 2023-06-28: qty 1

## 2023-06-28 MED ORDER — HYDROCODONE-ACETAMINOPHEN 5-325 MG PO TABS
1.0000 | ORAL_TABLET | Freq: Once | ORAL | Status: AC
  Administered 2023-06-28: 1 via ORAL
  Filled 2023-06-28: qty 1

## 2023-06-28 NOTE — ED Notes (Signed)
 ED Provider at bedside.

## 2023-06-28 NOTE — ED Provider Notes (Signed)
  EMERGENCY DEPARTMENT AT [facility] Provider Note   CSN: [id number] Arrival date & time: 06/28/23  2043     History {Add pertinent medical, surgical, social history, OB history to HPI:1} Chief Complaint  Patient presents with   Back Pain    Kyle Farmer is a 33 y.o. male.  Patient states bilateral low back pain that started while he was visiting [place]  in mid April.  He denies any injury.  Denies any history of chronic back problems.  He was seen in the ED in [place]  given a course of steroids and "2 other medications" that he does not know the name of.  Pain improved but got worse again about 3 days ago.  Pain is to his bilateral lumbar spine.  Does not radiate down his legs.  No associated weakness, numbness or tingling.  No pain with urination or blood in the urine.  No fever or vomiting.  No history of IV drug abuse or cancer.  No history of chronic back problems.  No bowel or bladder incontinence.  No chest pain or shortness of breath.  No cough or fever.  Pain is sometimes on the right side and sometimes on the left side.  He has not had any of the medications that he was given but is no longer taking anything for his back.  The history is provided by the patient.  Back Pain Associated symptoms: no abdominal pain, no chest pain, no dysuria, no fever, no headaches and no weakness        Home Medications Prior to Admission medications   Medication Sig Start Date End Date Taking? Authorizing Provider  doxycycline  (VIBRAMYCIN ) 100 MG capsule Take 1 capsule (100 mg total) by mouth 2 (two) times daily. 04/22/20   Erlinda Haws, NP      Allergies    Patient has no known allergies.    Review of Systems   Review of Systems  Constitutional:  Negative for activity change, appetite change and fever.  HENT:  Negative for congestion and rhinorrhea.   Respiratory:  Negative for cough, chest tightness and shortness of breath.   Cardiovascular:  Negative for chest  pain.  Gastrointestinal:  Negative for abdominal pain, nausea and vomiting.  Genitourinary:  Negative for dysuria, hematuria, scrotal swelling and testicular pain.  Musculoskeletal:  Positive for arthralgias, back pain and myalgias.  Skin:  Negative for rash.  Neurological:  Negative for dizziness, weakness and headaches.    all other systems are negative except as noted in the HPI and PMH.   Physical Exam Updated Vital Signs BP 131/89   Pulse 73   Temp 98.3 F (36.8 C)   Resp 20   Ht 5\' 3"  (1.6 m)   Wt 63.5 kg   SpO2 96%   BMI 24.80 kg/m  Physical Exam Vitals and nursing note reviewed.  Constitutional:      General: He is not in acute distress.    Appearance: He is well-developed.  HENT:     Head: Normocephalic and atraumatic.     Mouth/Throat:     Pharynx: No oropharyngeal exudate.  Eyes:     Conjunctiva/sclera: Conjunctivae normal.     Pupils: Pupils are equal, round, and reactive to light.  Neck:     Comments: No meningismus. Cardiovascular:     Rate and Rhythm: Normal rate and regular rhythm.     Heart sounds: Normal heart sounds. No murmur heard. Pulmonary:     Effort: Pulmonary effort is  normal. No respiratory distress.     Breath sounds: Normal breath sounds.  Abdominal:     Palpations: Abdomen is soft.     Tenderness: There is no abdominal tenderness. There is no guarding or rebound.  Musculoskeletal:        General: Tenderness present. Normal range of motion.     Cervical back: Normal range of motion and neck supple.     Comments: No midline lumbar pain.  Paraspinal lumbar pain. 5/5 strength in bilateral lower extremities. Ankle plantar and dorsiflexion intact. Great toe extension intact bilaterally. +2 DP and PT pulses. +2 patellar reflexes bilaterally. Normal gait.   Skin:    General: Skin is warm.  Neurological:     Mental Status: He is alert and oriented to person, place, and time.     Cranial Nerves: No cranial nerve deficit.     Motor: No  abnormal muscle tone.     Coordination: Coordination normal.     Comments: No ataxia on finger to nose bilaterally. No pronator drift. 5/5 strength throughout. CN 2-12 intact.Equal grip strength. Sensation intact.   Psychiatric:        Behavior: Behavior normal.     ED Results / Procedures / Treatments   Labs (all labs ordered are listed, but only abnormal results are displayed) Labs Reviewed  URINALYSIS, ROUTINE W REFLEX MICROSCOPIC    EKG None  Radiology No results found.  Procedures Procedures  {Document cardiac monitor, telemetry assessment procedure when appropriate:1}  Medications Ordered in ED Medications  ketorolac (TORADOL) 15 MG/ML injection 15 mg (has no administration in time range)  methocarbamol (ROBAXIN) tablet 500 mg (has no administration in time range)  predniSONE  (DELTASONE ) tablet 60 mg (has no administration in time range)  HYDROcodone-acetaminophen (NORCO/VICODIN) 5-325 MG per tablet 1 tablet (has no administration in time range)    ED Course/ Medical Decision Making/ A&P   {   Click here for ABCD2, HEART and other calculatorsREFRESH Note before signing :1}                              Medical Decision Making Amount and/or Complexity of Data Reviewed Labs: ordered. Decision-making details documented in ED Course. Radiology: ordered and independent interpretation performed. Decision-making details documented in ED Course. ECG/medicine tests: ordered and independent interpretation performed. Decision-making details documented in ED Course.  Risk Prescription drug management.   2 weeks of atraumatic low back pain.  Intact distal strength, sensation, pulses and reflexes.  Low concern for cord compression or cauda equina.  No history of IV drug abuse or cancer.  {Document critical care time when appropriate:1} {Document review of labs and clinical decision tools ie heart score, Chads2Vasc2 etc:1}  {Document your independent review of radiology  images, and any outside records:1} {Document your discussion with family members, caretakers, and with consultants:1} {Document social determinants of health affecting pt's care:1} {Document your decision making why or why not admission, treatments were needed:1} Final Clinical Impression(s) / ED Diagnoses Final diagnoses:  None    Rx / DC Orders ED Discharge Orders     None

## 2023-06-28 NOTE — ED Triage Notes (Signed)
 The pt is c/o lower back pain for 2 weeks  no known injury  no chronic back pain

## 2023-06-29 LAB — URINALYSIS, ROUTINE W REFLEX MICROSCOPIC
Bilirubin Urine: NEGATIVE
Glucose, UA: NEGATIVE mg/dL
Hgb urine dipstick: NEGATIVE
Ketones, ur: NEGATIVE mg/dL
Leukocytes,Ua: NEGATIVE
Nitrite: NEGATIVE
Protein, ur: NEGATIVE mg/dL
Specific Gravity, Urine: 1.02 (ref 1.005–1.030)
pH: 6 (ref 5.0–8.0)

## 2023-06-29 MED ORDER — METHYLPREDNISOLONE 4 MG PO TBPK: ORAL_TABLET | ORAL | 0 refills | Status: DC

## 2023-06-29 MED ORDER — METHOCARBAMOL 500 MG PO TABS: 500.0000 mg | ORAL_TABLET | Freq: Two times a day (BID) | ORAL | 0 refills | Status: DC

## 2023-06-29 NOTE — Discharge Instructions (Signed)
 Your testing is reassuring.  No evidence of fractures.  No evidence of serious spinal cord problem.  Take the muscle relaxers and steroids as prescribed. Return to the ER for worsening pain, weakness, numbness, tingling, bowel or bladder incontinence or other concerns.

## 2023-06-29 NOTE — ED Notes (Signed)
 Patient discharged in stable condition, education materials explained including, follow up, any prescriptions and reasons to return. Patient voiced agreement to education and discharge material.

## 2023-07-04 ENCOUNTER — Ambulatory Visit (HOSPITAL_COMMUNITY)
Admission: EM | Admit: 2023-07-04 | Discharge: 2023-07-04 | Disposition: A | Discharge status: Home / Self Care | Payer: Self-pay | Attending: Family Medicine | Admitting: Family Medicine

## 2023-07-04 ENCOUNTER — Encounter (HOSPITAL_COMMUNITY): Payer: Self-pay | Admitting: Emergency Medicine

## 2023-07-04 ENCOUNTER — Other Ambulatory Visit: Payer: Self-pay

## 2023-07-04 DIAGNOSIS — M545 Low back pain, unspecified: Secondary | ICD-10-CM

## 2023-07-04 MED ORDER — METHYLPREDNISOLONE 4 MG PO TBPK: ORAL_TABLET | ORAL | 0 refills | Status: AC

## 2023-07-04 MED ORDER — METHOCARBAMOL 500 MG PO TABS: 500.0000 mg | ORAL_TABLET | Freq: Two times a day (BID) | ORAL | 0 refills | Status: AC

## 2023-07-04 NOTE — ED Triage Notes (Signed)
 Pt arrive c/o recurrent lower back pain, denies any fall or injury requesting a pain shot.

## 2023-07-04 NOTE — Discharge Instructions (Signed)
HOME CARE INSTRUCTIONS: For many people, back pain returns. Since low back pain is rarely dangerous, it is often a condition that people can learn to manage on their own. Please remain active. It is stressful on the back to sit or stand in one place. Do not sit, drive, or stand in one place for more than 30 minutes at a time. Take short walks on level surfaces as soon as pain allows. Try to increase the length of time you walk each day. Do not stay in bed. Resting more than 1 or 2 days can delay your recovery. Do not avoid exercise or work. Your body is made to move. It is not dangerous to be active, even though your back may hurt. Your back will likely heal faster if you return to being active before your pain is gone. Over-the-counter medicines to reduce pain and inflammation are often the most helpful. ° °SEEK MEDICAL CARE IF: °You have pain that is not relieved with rest or medicine. °You have pain that does not improve in 1 week. °You have new symptoms. °You are generally not feeling well. ° °SEEK IMMEDIATE MEDICAL CARE IF: °You have pain that radiates from your back into your legs. °You develop new bowel or bladder control problems. °You have unusual weakness or numbness in your arms or legs. °You develop nausea or vomiting. °You develop abdominal pain. °You feel faint. °

## 2023-07-06 NOTE — ED Provider Notes (Signed)
 [facility]   [id number] 07/04/23 Arrival Time: 1151  ASSESSMENT & PLAN:  1. Acute bilateral low back pain without sciatica   H/O similar; mechanical in nature.  Able to ambulate here and hemodynamically stable. No indication for imaging of back at this time given no trauma and normal neurological exam. Discussed.  Meds ordered this encounter  Medications   methocarbamol  (ROBAXIN ) 500 MG tablet    Sig: Take 1 tablet (500 mg total) by mouth 2 (two) times daily.    Dispense:  20 tablet    Refill:  0   methylPREDNISolone  (MEDROL  DOSEPAK) 4 MG TBPK tablet    Sig: Take as directed.    Dispense:  1 each    Refill:  0   Work/school excuse note: provided. Medication sedation precautions given. Encourage ROM/movement as tolerated.  Recommend:  Follow-up Information     [facility].   Why: If symptoms worsen in any way. Contact information: [address] [place]  [phone]                Reviewed expectations re: course of current medical issues. Questions answered. Outlined signs and symptoms indicating need for more acute intervention. Patient verbalized understanding. After Visit Summary given.   SUBJECTIVE: History from: patient.  Kyle Farmer is a 33 y.o. male who presents with complaint of recurrent non-radiating bilateral lower back pain; denies trauma; relates to hard work. No extremity sensation changes or weakness. Normal bowel/bladder habits. Ambulatory here. No tx PTA.  OBJECTIVE:  Vitals:   07/04/23 1317  BP: 130/81  Pulse: 65  Resp: 18  Temp: 98.5 F (36.9 C)  TempSrc: Oral  SpO2: 98%    General appearance: alert; no distress HEENT: [place]; AT Neck: supple with FROM; without midline tenderness CV: regular Lungs: unlabored respirations; speaks full sentences without difficulty Abdomen: soft, non-tender; non-distended Back: poorly localized tenderness to palpation  over bilateral lumbar paraspinal musculature; FROM at waist; bruising: none; without midline tenderness Extremities: without edema; symmetrical without gross deformities; normal ROM of bilateral LE Skin: warm and dry Neurologic: normal gait; normal sensation and strength of bilateral LE Psychological: alert and cooperative; normal mood and affect    No Known Allergies  History reviewed. No pertinent past medical history. Social History   Socioeconomic History   Marital status: Single    Spouse name: Not on file   Number of children: Not on file   Years of education: Not on file   Highest education level: Not on file  Occupational History   Not on file  Tobacco Use   Smoking status: Some Days   Smokeless tobacco: Never  Vaping Use   Vaping status: Never Used  Substance and Sexual Activity   Alcohol use: Yes   Drug use: Yes    Types: Marijuana   Sexual activity: Not on file  Other Topics Concern   Not on file  Social History Narrative   Not on file   Social Drivers of Health   Financial Resource Strain: Not on file  Food Insecurity: Not on file  Transportation Needs: Not on file  Physical Activity: Not on file  Stress: Not on file  Social Connections: Not on file  Intimate Partner Violence: Not on file   Family History  Problem Relation Age of Onset   Healthy Mother    Past Surgical History:  Procedure Laterality Date   DENTAL SURGERY        Martin, Polly Brink,  MD 07/06/23 [phone]
